# Patient Record
Sex: Female | Born: 1940 | Race: White | Hispanic: No | Marital: Married | Smoking: Never smoker
Health system: Southern US, Community
[De-identification: ages and names within clinical notes are randomized; demographics above are authoritative.]

## PROBLEM LIST (undated history)

## (undated) DIAGNOSIS — E785 Hyperlipidemia, unspecified: Secondary | ICD-10-CM

## (undated) DIAGNOSIS — I10 Essential (primary) hypertension: Secondary | ICD-10-CM

## (undated) HISTORY — PX: GALLBLADDER SURGERY: SHX652

## (undated) HISTORY — DX: Hyperlipidemia, unspecified: E78.5

## (undated) HISTORY — PX: ABDOMINAL HYSTERECTOMY: SHX81

## (undated) HISTORY — DX: Essential (primary) hypertension: I10

---

## 2012-01-30 ENCOUNTER — Other Ambulatory Visit: Payer: Self-pay | Admitting: Cardiology

## 2012-01-30 DIAGNOSIS — N183 Chronic kidney disease, stage 3 unspecified: Secondary | ICD-10-CM

## 2012-02-02 ENCOUNTER — Encounter (INDEPENDENT_AMBULATORY_CARE_PROVIDER_SITE_OTHER): Payer: Medicare Other

## 2012-02-02 DIAGNOSIS — I701 Atherosclerosis of renal artery: Secondary | ICD-10-CM

## 2012-02-02 DIAGNOSIS — N183 Chronic kidney disease, stage 3 unspecified: Secondary | ICD-10-CM

## 2012-02-02 DIAGNOSIS — I700 Atherosclerosis of aorta: Secondary | ICD-10-CM

## 2012-12-29 ENCOUNTER — Other Ambulatory Visit (INDEPENDENT_AMBULATORY_CARE_PROVIDER_SITE_OTHER): Payer: Medicare Other | Admitting: *Deleted

## 2012-12-29 DIAGNOSIS — I701 Atherosclerosis of renal artery: Secondary | ICD-10-CM

## 2015-05-29 DIAGNOSIS — D510 Vitamin B12 deficiency anemia due to intrinsic factor deficiency: Secondary | ICD-10-CM | POA: Diagnosis not present

## 2015-05-29 DIAGNOSIS — D649 Anemia, unspecified: Secondary | ICD-10-CM | POA: Diagnosis not present

## 2015-05-29 DIAGNOSIS — H9319 Tinnitus, unspecified ear: Secondary | ICD-10-CM | POA: Diagnosis not present

## 2015-05-29 DIAGNOSIS — M949 Disorder of cartilage, unspecified: Secondary | ICD-10-CM | POA: Diagnosis not present

## 2015-05-29 DIAGNOSIS — M8580 Other specified disorders of bone density and structure, unspecified site: Secondary | ICD-10-CM | POA: Diagnosis not present

## 2015-05-29 DIAGNOSIS — R413 Other amnesia: Secondary | ICD-10-CM | POA: Diagnosis not present

## 2015-05-29 DIAGNOSIS — N189 Chronic kidney disease, unspecified: Secondary | ICD-10-CM | POA: Diagnosis not present

## 2015-06-12 DIAGNOSIS — D510 Vitamin B12 deficiency anemia due to intrinsic factor deficiency: Secondary | ICD-10-CM | POA: Diagnosis not present

## 2015-07-11 DIAGNOSIS — E538 Deficiency of other specified B group vitamins: Secondary | ICD-10-CM | POA: Diagnosis not present

## 2015-07-11 DIAGNOSIS — R600 Localized edema: Secondary | ICD-10-CM | POA: Diagnosis not present

## 2015-07-11 DIAGNOSIS — I150 Renovascular hypertension: Secondary | ICD-10-CM | POA: Diagnosis not present

## 2015-07-11 DIAGNOSIS — E785 Hyperlipidemia, unspecified: Secondary | ICD-10-CM | POA: Diagnosis not present

## 2015-07-11 DIAGNOSIS — I701 Atherosclerosis of renal artery: Secondary | ICD-10-CM | POA: Diagnosis not present

## 2015-07-11 DIAGNOSIS — N183 Chronic kidney disease, stage 3 (moderate): Secondary | ICD-10-CM | POA: Diagnosis not present

## 2015-07-11 DIAGNOSIS — R809 Proteinuria, unspecified: Secondary | ICD-10-CM | POA: Diagnosis not present

## 2015-07-16 DIAGNOSIS — D510 Vitamin B12 deficiency anemia due to intrinsic factor deficiency: Secondary | ICD-10-CM | POA: Diagnosis not present

## 2015-07-19 DIAGNOSIS — E559 Vitamin D deficiency, unspecified: Secondary | ICD-10-CM | POA: Diagnosis not present

## 2015-07-19 DIAGNOSIS — D649 Anemia, unspecified: Secondary | ICD-10-CM | POA: Diagnosis not present

## 2015-07-19 DIAGNOSIS — Z79899 Other long term (current) drug therapy: Secondary | ICD-10-CM | POA: Diagnosis not present

## 2015-07-19 DIAGNOSIS — E785 Hyperlipidemia, unspecified: Secondary | ICD-10-CM | POA: Diagnosis not present

## 2015-07-19 DIAGNOSIS — Z01419 Encounter for gynecological examination (general) (routine) without abnormal findings: Secondary | ICD-10-CM | POA: Diagnosis not present

## 2015-07-19 DIAGNOSIS — D509 Iron deficiency anemia, unspecified: Secondary | ICD-10-CM | POA: Diagnosis not present

## 2015-08-08 DIAGNOSIS — H6983 Other specified disorders of Eustachian tube, bilateral: Secondary | ICD-10-CM | POA: Diagnosis not present

## 2015-09-03 DIAGNOSIS — R412 Retrograde amnesia: Secondary | ICD-10-CM | POA: Diagnosis not present

## 2015-09-13 DIAGNOSIS — Z1231 Encounter for screening mammogram for malignant neoplasm of breast: Secondary | ICD-10-CM | POA: Diagnosis not present

## 2015-09-25 ENCOUNTER — Ambulatory Visit (INDEPENDENT_AMBULATORY_CARE_PROVIDER_SITE_OTHER): Payer: Medicare HMO | Admitting: Neurology

## 2015-09-25 ENCOUNTER — Encounter: Payer: Self-pay | Admitting: Neurology

## 2015-09-25 ENCOUNTER — Other Ambulatory Visit (INDEPENDENT_AMBULATORY_CARE_PROVIDER_SITE_OTHER): Payer: Medicare HMO

## 2015-09-25 VITALS — BP 132/70 | HR 64 | Resp 16 | Wt 123.0 lb

## 2015-09-25 DIAGNOSIS — F0390 Unspecified dementia without behavioral disturbance: Secondary | ICD-10-CM

## 2015-09-25 DIAGNOSIS — F03A0 Unspecified dementia, mild, without behavioral disturbance, psychotic disturbance, mood disturbance, and anxiety: Secondary | ICD-10-CM

## 2015-09-25 DIAGNOSIS — E785 Hyperlipidemia, unspecified: Secondary | ICD-10-CM

## 2015-09-25 DIAGNOSIS — I10 Essential (primary) hypertension: Secondary | ICD-10-CM | POA: Diagnosis not present

## 2015-09-25 LAB — VITAMIN B12: Vitamin B-12: 474 pg/mL (ref 211–911)

## 2015-09-25 LAB — TSH: TSH: 1.06 u[IU]/mL (ref 0.35–4.50)

## 2015-09-25 MED ORDER — DONEPEZIL HCL 10 MG PO TABS: ORAL_TABLET | ORAL | Status: DC

## 2015-09-25 NOTE — Progress Notes (Signed)
NEUROLOGY CONSULTATION NOTE  Misty Meadows MRN: [id number] DOB: [birth year]  Referring provider: Dr. Lovette Cliche Primary care provider: Dr. Lovette Cliche  Reason for consult:  Memory loss  Dear Dr Lin Landsman:  Thank you for your kind referral of Misty Meadows for consultation of the above symptoms. Although her history is well known to you, please allow me to reiterate it for the purpose of our medical record. The patient was accompanied to the clinic by her husband and son who also provide collateral information. Her other son was in the waiting room and came in towards the end of the visit. Records and images were personally reviewed where available.  HISTORY OF PRESENT ILLNESS: This is a 75 year old right-handed woman with a history of hypertension, hyperlipidemia, presenting for evaluation of worsening memory. She reports that her memory is "not good," she forgets names, sometimes of her own family members, "sometimes my mind just shuts down." She feels memory started changing in the past year, her husband started noticing some differences around 4 years ago, but her son noticed changes 6-8 years ago. 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 She was in charge of bill payments until last year when she asked her husband to do them because she could not do it anymore. He has been making sure she takes her medications over the past year. Family has convinced her not to drive, because as a passenger she would think they need to go the other way. She frequently misplaces things and is always looking for her pocketbook. She has left the stove on. Her family is also concerned about personality changes, one son thinks she is paranoid that people are whispering about her and conspiring against her. Family reports that in her mind there is "another Edd Arbour" (her husband), they  celebrated their 56th anniversary recently and she did not recognize him, or would say someone is driving his truck or says somebody is writing her checks (her granddaughter has been helping with this).   She denies any headaches, dizziness, diplopia, dysarthria, dysphagia, neck/back pain, focal numbness/tingling/weakness, bowel/bladder dysfunction, anosmia, or tremors. She has nightmares and would sometimes scream out at night. She gets scared and anxious easily when she has bad days with her memory. Her mother had Alzheimer's disease. She denies any history of head injuries or alcohol intake.   PAST MEDICAL HISTORY: Past Medical History  Diagnosis Date  . Hypertension   . Hyperlipemia     PAST SURGICAL HISTORY: Past Surgical History  Procedure Laterality Date  . Abdominal hysterectomy    . Gallbladder surgery      MEDICATIONS: Norvasc Aspirin Fenofibrate Lopressor EHT age defying supplement Zocor   No current facility-administered medications on file prior to visit.    ALLERGIES: No Known Allergies  FAMILY HISTORY: Family History  Problem Relation Age of Onset  . Heart disease Father   . Heart disease Mother   . Stroke Sister     SOCIAL HISTORY: Social History   Social History  . Marital Status: Married    Spouse Name: N/A  . Number of Children: 4  . Years of Education: N/A   Occupational History  . Not on file.   Social History Main Topics  . Smoking status: Never Smoker   . Smokeless tobacco: Never Used  . Alcohol Use: No  . Drug Use: No  . Sexual  Activity: Not on file   Other Topics Concern  . Not on file   Social History Narrative    REVIEW OF SYSTEMS: Constitutional: No fevers, chills, or sweats, no generalized fatigue, change in appetite Eyes: No visual changes, double vision, eye pain Ear, nose and throat: No hearing loss, ear pain, nasal congestion, sore throat Cardiovascular: No chest pain, palpitations Respiratory:  No shortness of  breath at rest or with exertion, wheezes GastrointestinaI: No nausea, vomiting, diarrhea, abdominal pain, fecal incontinence Genitourinary:  No dysuria, urinary retention or frequency Musculoskeletal:  No neck pain, back pain Integumentary: No rash, pruritus, skin lesions Neurological: as above Psychiatric: No depression, insomnia, anxiety Endocrine: No palpitations, fatigue, diaphoresis, mood swings, change in appetite, change in weight, increased thirst Hematologic/Lymphatic:  No anemia, purpura, petechiae. Allergic/Immunologic: no itchy/runny eyes, nasal congestion, recent allergic reactions, rashes  PHYSICAL EXAM: Filed Vitals:   09/25/15 1243  BP: 132/70  Pulse: 64  Resp: 16   General: No acute distress Head:  Normocephalic/atraumatic Eyes: Fundoscopic exam shows bilateral sharp discs, no vessel changes, exudates, or hemorrhages Neck: supple, no paraspinal tenderness, full range of motion Back: No paraspinal tenderness Heart: regular rate and rhythm Lungs: Clear to auscultation bilaterally. Vascular: No carotid bruits. Skin/Extremities: No rash, no edema Neurological Exam: Mental status: alert and oriented to person, place, and time, no dysarthria or aphasia, Fund of knowledge is appropriate.  Remote memory intact.  Attention and concentration are normal.    Able to name objects and repeat phrases. CDT 76 MMSE - Mini Mental State Exam 09/25/2015  Orientation to time 5  Orientation to Place 5  Registration 3  Attention/ Calculation 5  Recall 0  Language- name 2 objects 2  Language- repeat 1  Language- follow 3 step command 3  Language- read & follow direction 1  Write a sentence 1  Copy design 0  Total score 26   Cranial nerves: CN I: not tested CN II: pupils equal, round and reactive to light, visual fields intact, fundi unremarkable. CN III, IV, VI:  full range of motion, no nystagmus, no ptosis CN V: facial sensation intact CN VII: upper and lower face  symmetric CN VIII: hearing intact to finger rub CN IX, X: gag intact, uvula midline CN XI: sternocleidomastoid and trapezius muscles intact CN XII: tongue midline Bulk & Tone: normal, no fasciculations. Motor: 5/5 throughout with no pronator drift. Sensation: decreased vibration to ankles bilaterally, otherwise intact to light touch, cold, pin, and joint position sense.  No extinction to double simultaneous stimulation.  Romberg test negative Deep Tendon Reflexes: +2 throughout, no ankle clonus Plantar responses: downgoing bilaterally Cerebellar: no incoordination on finger to nose, heel to shin. No dysdiadochokinesia Gait: narrow-based and steady, able to tandem walk adequately. Tremor: none  IMPRESSION: This is a 75 year old right-handed woman with a history of hypertension, hyperlipidemia, presenting for evaluation of worsening memory. MMSE today is 26/30 indicating mild cognitive impairment, however history is suggestive of mild dementia. We discussed different causes of memory loss. Check TSH and B12. MRI brain without contrast will be ordered to assess for underlying structural abnormality and assess vascular load. We discussed that she may benefit from starting cholinesterase inhibitors such as Aricept, side effects and expectations from the medication were discussed. She was given a prescription for Aricept 5mg  daily for 1 month, then increase to 10mg  daily. Family asked about a supplement they found online to help with memory, they have not noticed much change, we discussed at this time  there are no evidence-based studies that this is helpful, would stop medication. We discussed psychological and behavioral changes that can also be seen with dementia, continue to monitor. We discussed the importance of control of vascular risk factors, physical exercise, and brain stimulation exercises for brain health. She will follow-up in 7-8 months and knows to call for any changes.   Thank you for  allowing me to participate in the care of this patient. Please do not hesitate to call for any questions or concerns.   Ellouise Newer, M.D.  CC: Dr. Lin Landsman

## 2015-09-25 NOTE — Patient Instructions (Addendum)
1. Bloodwork for TSH, B12 2. Schedule MRI brain without contrast 3. Start Aricept 10mg : take 1/2 tablet daily for 1 month, then increase to 1 tablet daily  4. Control of blood pressure, cholesterol, as well as physical exercise and brain stimulation exercises are important for brain health 5. Look into the [employer] of [place] for support groups, more informational resources  [facility] [address], [id number] [place], [place] [address] P: [phone] 6. Follow-up in 7-8 months

## 2015-09-27 ENCOUNTER — Telehealth: Payer: Self-pay | Admitting: Family Medicine

## 2015-09-27 NOTE — Telephone Encounter (Signed)
Patient's husband was notified of results.

## 2015-09-27 NOTE — Telephone Encounter (Signed)
-----   Message from Cameron Sprang, MD sent at 09/26/2015 10:03 AM EST ----- Pls let her/family know thyroid and B12 levels are normal. Thanks

## 2015-09-28 DIAGNOSIS — F0390 Unspecified dementia without behavioral disturbance: Secondary | ICD-10-CM | POA: Insufficient documentation

## 2015-09-28 DIAGNOSIS — F03A0 Unspecified dementia, mild, without behavioral disturbance, psychotic disturbance, mood disturbance, and anxiety: Secondary | ICD-10-CM | POA: Insufficient documentation

## 2015-09-28 DIAGNOSIS — E785 Hyperlipidemia, unspecified: Secondary | ICD-10-CM | POA: Insufficient documentation

## 2015-09-28 DIAGNOSIS — I10 Essential (primary) hypertension: Secondary | ICD-10-CM | POA: Insufficient documentation

## 2015-10-01 ENCOUNTER — Ambulatory Visit: Payer: Self-pay | Admitting: Neurology

## 2015-10-02 ENCOUNTER — Telehealth: Payer: Self-pay | Admitting: Neurology

## 2015-10-02 NOTE — Telephone Encounter (Signed)
Pt husband needs to talk to someone about medication donepezil she is on a half of a pill and is very tried is this normal please call [phone]

## 2015-10-02 NOTE — Telephone Encounter (Signed)
I spoke with patient's husband after talking to Dr. Delice Lesch about patients current symptom. Per Dr. Delice Lesch for now she wants patient to continue with taking the 1/2 tablet a day since she just started on it last week. Want to see if this subsides. The patients husband also mentioned to me that patient was just started on a sleeping as well by her pcp and when he spoke with them he states they told him that the sleeping pill could be causing this. So they rec that patient not take the sleeping pill and to try at first to go to sleep without it and if they felt like she needed it try giving her half a pill. Patient is going to continue with the Aricept for now and will call if there are anymore issues or questions.

## 2015-10-02 NOTE — Telephone Encounter (Signed)
Tried returning call twice, line is busy. Will call again.

## 2015-10-07 DIAGNOSIS — G4762 Sleep related leg cramps: Secondary | ICD-10-CM | POA: Diagnosis not present

## 2015-10-07 DIAGNOSIS — R252 Cramp and spasm: Secondary | ICD-10-CM | POA: Diagnosis not present

## 2015-10-10 ENCOUNTER — Ambulatory Visit (HOSPITAL_COMMUNITY): Admission: RE | Admit: 2015-10-10 | Payer: Medicare HMO | Source: Ambulatory Visit

## 2015-10-12 ENCOUNTER — Ambulatory Visit (HOSPITAL_COMMUNITY)
Admission: RE | Admit: 2015-10-12 | Discharge: 2015-10-12 | Disposition: A | Discharge status: Home / Self Care | Payer: Medicare HMO | Source: Ambulatory Visit | Attending: Neurology | Admitting: Neurology

## 2015-10-12 ENCOUNTER — Telehealth: Payer: Self-pay | Admitting: Neurology

## 2015-10-12 DIAGNOSIS — I998 Other disorder of circulatory system: Secondary | ICD-10-CM | POA: Insufficient documentation

## 2015-10-12 DIAGNOSIS — F0390 Unspecified dementia without behavioral disturbance: Secondary | ICD-10-CM | POA: Insufficient documentation

## 2015-10-12 NOTE — Telephone Encounter (Signed)
-----   Message from Cameron Sprang, MD sent at 10/12/2015 12:12 PM EST ----- Pls let patient know I reviewed MRI brain, it is unremarkable, no evidence of tumor, stroke, or bleed. It shows age-related changes. Thanks

## 2015-10-12 NOTE — Telephone Encounter (Signed)
Patient made aware.

## 2015-10-16 DIAGNOSIS — K921 Melena: Secondary | ICD-10-CM | POA: Diagnosis not present

## 2015-10-16 DIAGNOSIS — K648 Other hemorrhoids: Secondary | ICD-10-CM | POA: Diagnosis not present

## 2015-10-16 DIAGNOSIS — N189 Chronic kidney disease, unspecified: Secondary | ICD-10-CM | POA: Diagnosis not present

## 2015-10-16 DIAGNOSIS — R350 Frequency of micturition: Secondary | ICD-10-CM | POA: Diagnosis not present

## 2015-11-15 ENCOUNTER — Telehealth: Payer: Self-pay | Admitting: Family Medicine

## 2015-11-15 DIAGNOSIS — H353131 Nonexudative age-related macular degeneration, bilateral, early dry stage: Secondary | ICD-10-CM | POA: Diagnosis not present

## 2015-11-15 DIAGNOSIS — H26491 Other secondary cataract, right eye: Secondary | ICD-10-CM | POA: Diagnosis not present

## 2015-11-15 DIAGNOSIS — H25812 Combined forms of age-related cataract, left eye: Secondary | ICD-10-CM | POA: Diagnosis not present

## 2015-11-15 NOTE — Telephone Encounter (Signed)
Go back to 1/2 tab daily for another 2 weeks and see if bowel symptoms improve. If they do, increase again to 1 tablet. Thanks

## 2015-11-15 NOTE — Telephone Encounter (Signed)
Patients husband called after hours on call center yesterday @ 5:39 pm. He stated that since patient started donepezil she has had increased bowel movements. She has been having minor rectal bleeding with these frequent bowel movements.   I spoke with patient & her husband this morning. Bowel movements are not diarrhea or loose, but frequent. He states that when patient started on 1/2 pill bowel movements increased some. When patient went to a whole pill patient has bowel movements at least 3 times a day, some days more than 3. Patient states that she has no stomach pain or cramping. She does feel rectal pressure majority of the time, which is usually relieved some after a BM. Patient states she does have hemorrhoids as well. Patients husband does state that while patient was on 1/2 pill they didn't notice any difference with her memory, but when she increased to the whole pill he did notice improvement. Please advise.

## 2015-11-15 NOTE — Telephone Encounter (Signed)
Spoke with Mr. Door again and notified him of advisement. Advised him to call us back after 2 weeks if patients sxs still haven't improved.

## 2015-11-19 DIAGNOSIS — J209 Acute bronchitis, unspecified: Secondary | ICD-10-CM | POA: Diagnosis not present

## 2015-11-21 DIAGNOSIS — I10 Essential (primary) hypertension: Secondary | ICD-10-CM | POA: Diagnosis not present

## 2015-11-21 DIAGNOSIS — R42 Dizziness and giddiness: Secondary | ICD-10-CM | POA: Diagnosis not present

## 2015-11-23 DIAGNOSIS — Z0000 Encounter for general adult medical examination without abnormal findings: Secondary | ICD-10-CM | POA: Diagnosis not present

## 2015-11-23 DIAGNOSIS — J329 Chronic sinusitis, unspecified: Secondary | ICD-10-CM | POA: Diagnosis not present

## 2015-11-23 DIAGNOSIS — I10 Essential (primary) hypertension: Secondary | ICD-10-CM | POA: Diagnosis not present

## 2015-12-03 DIAGNOSIS — R5383 Other fatigue: Secondary | ICD-10-CM | POA: Diagnosis not present

## 2015-12-03 DIAGNOSIS — I951 Orthostatic hypotension: Secondary | ICD-10-CM | POA: Diagnosis not present

## 2015-12-31 DIAGNOSIS — N183 Chronic kidney disease, stage 3 (moderate): Secondary | ICD-10-CM | POA: Diagnosis not present

## 2015-12-31 DIAGNOSIS — R809 Proteinuria, unspecified: Secondary | ICD-10-CM | POA: Diagnosis not present

## 2016-01-04 DIAGNOSIS — R030 Elevated blood-pressure reading, without diagnosis of hypertension: Secondary | ICD-10-CM | POA: Diagnosis not present

## 2016-01-04 DIAGNOSIS — R69 Illness, unspecified: Secondary | ICD-10-CM | POA: Diagnosis not present

## 2016-01-10 DIAGNOSIS — N183 Chronic kidney disease, stage 3 (moderate): Secondary | ICD-10-CM | POA: Diagnosis not present

## 2016-01-10 DIAGNOSIS — R809 Proteinuria, unspecified: Secondary | ICD-10-CM | POA: Diagnosis not present

## 2016-01-10 DIAGNOSIS — E785 Hyperlipidemia, unspecified: Secondary | ICD-10-CM | POA: Diagnosis not present

## 2016-01-10 DIAGNOSIS — I701 Atherosclerosis of renal artery: Secondary | ICD-10-CM | POA: Diagnosis not present

## 2016-01-17 ENCOUNTER — Encounter: Payer: Self-pay | Admitting: Family Medicine

## 2016-03-10 DIAGNOSIS — B0229 Other postherpetic nervous system involvement: Secondary | ICD-10-CM | POA: Diagnosis not present

## 2016-05-15 DIAGNOSIS — H353131 Nonexudative age-related macular degeneration, bilateral, early dry stage: Secondary | ICD-10-CM | POA: Diagnosis not present

## 2016-05-15 DIAGNOSIS — H26491 Other secondary cataract, right eye: Secondary | ICD-10-CM | POA: Diagnosis not present

## 2016-05-15 DIAGNOSIS — H25812 Combined forms of age-related cataract, left eye: Secondary | ICD-10-CM | POA: Diagnosis not present

## 2016-05-16 ENCOUNTER — Ambulatory Visit (INDEPENDENT_AMBULATORY_CARE_PROVIDER_SITE_OTHER): Payer: Medicare HMO | Admitting: Neurology

## 2016-05-16 ENCOUNTER — Encounter: Payer: Self-pay | Admitting: Neurology

## 2016-05-16 ENCOUNTER — Ambulatory Visit: Payer: Self-pay | Admitting: Neurology

## 2016-05-16 VITALS — BP 130/72 | HR 69 | Temp 97.5°F | Wt 132.1 lb

## 2016-05-16 DIAGNOSIS — I10 Essential (primary) hypertension: Secondary | ICD-10-CM | POA: Diagnosis not present

## 2016-05-16 DIAGNOSIS — F03A0 Unspecified dementia, mild, without behavioral disturbance, psychotic disturbance, mood disturbance, and anxiety: Secondary | ICD-10-CM

## 2016-05-16 DIAGNOSIS — F0390 Unspecified dementia without behavioral disturbance: Secondary | ICD-10-CM

## 2016-05-16 DIAGNOSIS — E785 Hyperlipidemia, unspecified: Secondary | ICD-10-CM | POA: Diagnosis not present

## 2016-05-16 MED ORDER — RIVASTIGMINE TARTRATE 1.5 MG PO CAPS: 1.5000 mg | ORAL_CAPSULE | Freq: Two times a day (BID) | ORAL | 3 refills | Status: DC

## 2016-05-16 NOTE — Progress Notes (Signed)
NEUROLOGY FOLLOW UP OFFICE NOTE  Misty Meadows [id number]  HISTORY OF PRESENT ILLNESS: I had the pleasure of seeing Misty Meadows in follow-up in the neurology clinic on 05/16/2016.  The patient was last seen 7 months ago for worsening memory. She is again accompanied by her husband and son who help supplement the history today.  Records and images were personally reviewed where available.  I personally reviewed MRI brain without contrast which did not show any acute changes, there was mild diffuse atrophy and mild chronic microvascular disease. TSH and B12 normal. Her MMSE in February 2017 was 26/30. She was started on Aricept 10mg  daily. Family report that this made a "100% change" in her, she could remember better and was sharper. Her son also reported that she was better overall since her last visit, but still has memory gaps. Her husband reports she stacks dishes a different way now. She recalls names better now. Her husband feels she was resting better, but now is not resting so well again. She does not drive and states her family will not let her. She feels her memory is "not that great." She had a "depression feeling" this week. Her husband reports that if he corrects her, she thinks he is yelling at her, then 5 minutes later she is okay. She reports there are days that she gets very sensitive. They deny any paranoia. She is overall tolerating the Aricept except for nightly bad dreams, sometimes she wakes up screaming. She denies any headaches but has a vibrating sensation in the back of her head, she could her popping and cracking, sometimes family sitting beside her could audibly hear it as well. She has neck pain. No focal numbness/tingling/weakness. No falls.   HPI 09/25/2015: This is a 75 yo RH woman with a history of hypertension, hyperlipidemia, with worsening memory. She reports that her memory is "not good," she forgets names, sometimes of her own family members, "sometimes my mind just  shuts down." She feels memory started changing in the past year, her husband started noticing some differences around 4 years ago, but her son noticed changes 6-8 years ago. 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 She was in charge of bill payments until last year when she asked her husband to do them because she could not do it anymore. He has been making sure she takes her medications over the past year. Family has convinced her not to drive, because as a passenger she would think they need to go the other way. She frequently misplaces things and is always looking for her pocketbook. She has left the stove on. Her family is also concerned about personality changes, one son thinks she is paranoid that people are whispering about her and conspiring against her. Family reports that in her mind there is "another Edd Arbour" (her husband), they celebrated their 56th anniversary recently and she did not recognize him, or would say someone is driving his truck or says somebody is writing her checks (her granddaughter has been helping with this). Her mother had Alzheimer's disease. She denies any history of head injuries or alcohol intake.   PAST MEDICAL HISTORY: Past Medical History:  Diagnosis Date  . Hyperlipemia   . Hypertension     MEDICATIONS: Current Outpatient Prescriptions on File Prior to Visit  Medication Sig Dispense Refill  . amLODipine (NORVASC) 10 MG tablet Take  10 mg by mouth daily.    Marland Kitchen aspirin 81 MG tablet Take 81 mg by mouth daily.    Marland Kitchen donepezil (ARICEPT) 10 MG tablet Take 1/2 tablet daily for 1 month, then increase to 1 tablet daily 30 tablet 11  . fenofibrate 160 MG tablet Take 160 mg by mouth daily.    . metoprolol tartrate (LOPRESSOR) 25 MG tablet Take 25 mg by mouth 2 (two) times daily.    . Multiple Vitamins-Minerals (ICAPS MV PO) Take by mouth daily.      . NON FORMULARY EHT age defying supplement; Take 2 tablets daily    . simvastatin (ZOCOR) 20 MG tablet Take 20 mg by mouth daily.     No current facility-administered medications on file prior to visit.     ALLERGIES: No Known Allergies  FAMILY HISTORY: Family History  Problem Relation Age of Onset  . Heart disease Father   . Heart disease Mother   . Stroke Sister     SOCIAL HISTORY: Social History   Social History  . Marital status: Married    Spouse name: N/A  . Number of children: 4  . Years of education: N/A   Occupational History  . Not on file.   Social History Main Topics  . Smoking status: Never Smoker  . Smokeless tobacco: Never Used  . Alcohol use No  . Drug use: No  . Sexual activity: Not on file   Other Topics Concern  . Not on file   Social History Narrative  . No narrative on file    REVIEW OF SYSTEMS: Constitutional: No fevers, chills, or sweats, no generalized fatigue, change in appetite Eyes: No visual changes, double vision, eye pain Ear, nose and throat: No hearing loss, ear pain, nasal congestion, sore throat Cardiovascular: No chest pain, palpitations Respiratory:  No shortness of breath at rest or with exertion, wheezes GastrointestinaI: No nausea, vomiting, diarrhea, abdominal pain, fecal incontinence Genitourinary:  No dysuria, urinary retention or frequency Musculoskeletal:  No neck pain, back pain Integumentary: No rash, pruritus, skin lesions Neurological: as above Psychiatric: No depression, insomnia, anxiety Endocrine: No palpitations, fatigue, diaphoresis, mood swings, change in appetite, change in weight, increased thirst Hematologic/Lymphatic:  No anemia, purpura, petechiae. Allergic/Immunologic: no itchy/runny eyes, nasal congestion, recent allergic reactions, rashes  PHYSICAL EXAM: Vitals:   05/16/16 1025  BP: 130/72  Pulse: 69  Temp: 97.5 F (36.4 C)   General: No acute distress Head:   Normocephalic/atraumatic Neck: supple, no paraspinal tenderness, full range of motion with occasional crepitus Heart:  Regular rate and rhythm Lungs:  Clear to auscultation bilaterally Back: No paraspinal tenderness Skin/Extremities: No rash, no edema Neurological Exam: alert and oriented to person, place, and time. No aphasia or dysarthria. Fund of knowledge is appropriate.  Recent and remote memory are intact.  Attention and concentration are normal.    Able to name objects and repeat phrases. CDT 4/5 MMSE - Mini Mental State Exam 05/16/2016 09/25/2015  Orientation to time 5 5  Orientation to Place 5 5  Registration 3 3  Attention/ Calculation 5 5  Recall 3 0  Language- name 2 objects 2 2  Language- repeat 1 1  Language- follow 3 step command 2 3  Language- read & follow direction 1 1  Write a sentence 1 1  Copy design 0 0  Total score 28 26   Cranial nerves: Pupils equal, round, reactive to light.  Extraocular movements intact with no nystagmus. Visual fields full. Facial sensation intact. No  facial asymmetry. Tongue, uvula, palate midline.  Motor: Bulk and tone normal, muscle strength 5/5 throughout with no pronator drift.  Sensation to light touch intact.  No extinction to double simultaneous stimulation.  Deep tendon reflexes 2+ throughout, toes downgoing.  Finger to nose testing intact.  Gait narrow-based and steady, able to tandem walk adequately.  Romberg negative.  IMPRESSION: This is a 75 yo RH woman with a history of hypertension, hyperlipidemia, with worsening memory. MMSE in February 2017 was 26/30, indicating mild cognitive impairment, however history was suggestive of mild dementia. MMSE today better 28/30, family has reported an improvement as well with Aricept, however she does not like the bad dreams and would like to switch medications. She will start Exelon 1.5mg  BID, we will uptitrate as tolerated, side effects were discussed.  We again discussed the importance of control  of vascular risk factors, physical exercise, and brain stimulation exercises for brain health. She will follow-up in 6 months and knows to call for any changes  Thank you for allowing me to participate in her care.  Please do not hesitate to call for any questions or concerns.  The duration of this appointment visit was 25 minutes of face-to-face time with the patient.  Greater than 50% of this time was spent in counseling, explanation of diagnosis, planning of further management, and coordination of care.   Ellouise Newer, M.D.   CC: Dr. Lin Landsman

## 2016-05-16 NOTE — Patient Instructions (Signed)
1. Stop the donepezil. 2. Start Rivastigmine 1.5mg : Take 1 capsule twice a day 3. Control of blood pressure, cholesterol, as well as physical exercise and brain stimulation exercises are important for brain health. Look into the [place] diet  4. Follow-up in 6 months, call for any changes

## 2016-05-19 DIAGNOSIS — Z23 Encounter for immunization: Secondary | ICD-10-CM | POA: Diagnosis not present

## 2016-05-22 ENCOUNTER — Telehealth: Payer: Self-pay

## 2016-05-22 DIAGNOSIS — N390 Urinary tract infection, site not specified: Secondary | ICD-10-CM | POA: Diagnosis not present

## 2016-05-22 DIAGNOSIS — N3000 Acute cystitis without hematuria: Secondary | ICD-10-CM | POA: Diagnosis not present

## 2016-05-22 DIAGNOSIS — R51 Headache: Secondary | ICD-10-CM | POA: Diagnosis not present

## 2016-05-22 DIAGNOSIS — R112 Nausea with vomiting, unspecified: Secondary | ICD-10-CM | POA: Diagnosis not present

## 2016-05-22 DIAGNOSIS — R030 Elevated blood-pressure reading, without diagnosis of hypertension: Secondary | ICD-10-CM | POA: Diagnosis not present

## 2016-05-22 DIAGNOSIS — K5730 Diverticulosis of large intestine without perforation or abscess without bleeding: Secondary | ICD-10-CM | POA: Diagnosis not present

## 2016-05-22 DIAGNOSIS — I10 Essential (primary) hypertension: Secondary | ICD-10-CM | POA: Diagnosis not present

## 2016-05-22 DIAGNOSIS — R0602 Shortness of breath: Secondary | ICD-10-CM | POA: Diagnosis not present

## 2016-05-22 DIAGNOSIS — R109 Unspecified abdominal pain: Secondary | ICD-10-CM | POA: Diagnosis not present

## 2016-05-22 NOTE — Telephone Encounter (Signed)
Notified patient's husbands of the options. They want to try increasing Rivastigmine to two tablets twice a day. Advised husband to notify office with any changes or not seeing a difference with the increased dosage.

## 2016-05-22 NOTE — Telephone Encounter (Signed)
Options are increasing the medication to 2 tablets twice a day, or going back to the Aricept and keeping an eye on the bad dreams. Thanks

## 2016-05-22 NOTE — Telephone Encounter (Signed)
Pt's husband called and states since she stopped the Donepezil and started Rivastigmine she is declining. Pt is now asking questions such as where the bathroom is and how long she has been cooking in their kitchen. Please advise.

## 2016-05-23 DIAGNOSIS — K5730 Diverticulosis of large intestine without perforation or abscess without bleeding: Secondary | ICD-10-CM | POA: Diagnosis not present

## 2016-05-26 ENCOUNTER — Telehealth: Payer: Self-pay | Admitting: Neurology

## 2016-05-26 ENCOUNTER — Other Ambulatory Visit: Payer: Self-pay

## 2016-05-26 MED ORDER — DONEPEZIL HCL 10 MG PO TABS: 10.0000 mg | ORAL_TABLET | Freq: Every day | ORAL | 11 refills | Status: DC

## 2016-05-26 NOTE — Telephone Encounter (Signed)
Advised patients husband. They agree for patient to go back on Aricept. Rx sent to pharmacy.

## 2016-05-26 NOTE — Telephone Encounter (Signed)
Spoke with patients husband and he states since her increase of Rivastigmine on 10/5 she has been nauseous and throwing up every evening. He states he also took her to ED on Thursday night because her BP went up. They are wanting to know if these are normal side effects of this medication. If so they want to know if you think patient should just go back to taking Aricept. Please advise.

## 2016-05-26 NOTE — Telephone Encounter (Signed)
PT called and would like a call back/Dawn CB# [phone]

## 2016-05-26 NOTE — Telephone Encounter (Signed)
It seems she is having too much trouble with this medication. If they are okay with going back to Aricept, would stop Rivastigmine for a week until she feels better, then restart the Aricept once she is feeling well. Thanks

## 2016-05-27 NOTE — Telephone Encounter (Signed)
Her husband called back this morning complaining of the new medication his wife Misty Meadows was put on. She has stopped the medicine. He said she was really "out of it" last night. He would like you to please call him at [phone]. Thank you

## 2016-05-27 NOTE — Telephone Encounter (Signed)
Ok to start the Aricept, although I'm not quite sure all of this is due to medication. If she is still having these symptoms despite starting Aricept, they should call her PCP to have her evaluated. Thanks.

## 2016-05-27 NOTE — Telephone Encounter (Signed)
Notified patient's husband of below. Verbalized understanding.

## 2016-05-27 NOTE — Telephone Encounter (Signed)
Patients husband called and states without any medication his wife was up all night pacing the floors and doing crazy things. She did not have any nausea or throwing up yesterday evening. Husband wants to know if its okay to go ahead and start Aricept? He also asked if there is something he could give her to help her sleep? Please advise.

## 2016-05-28 DIAGNOSIS — N3000 Acute cystitis without hematuria: Secondary | ICD-10-CM | POA: Diagnosis not present

## 2016-05-28 DIAGNOSIS — N3090 Cystitis, unspecified without hematuria: Secondary | ICD-10-CM | POA: Diagnosis not present

## 2016-06-02 ENCOUNTER — Telehealth: Payer: Self-pay | Admitting: Neurology

## 2016-06-02 NOTE — Telephone Encounter (Signed)
Patient husband called and want would like to talk to someone about wife medication please call [phone]  patient husband name is Edd Arbour

## 2016-06-02 NOTE — Telephone Encounter (Signed)
Ok to give in morning, thanks

## 2016-06-02 NOTE — Telephone Encounter (Signed)
Patient's husband wants to know if its okay to give his wife the Aricept in the mornings. When he started her back on the medication the bottle says to give at night but she's having the bad dreams, gets into things, and he feels it starts to wear off during the day. He gave her a dose last night but does not want it to get out of her system so if he can start giving it in the morning would this morning be okay to start? Please advise.

## 2016-06-02 NOTE — Telephone Encounter (Signed)
Patient advised.

## 2016-06-11 ENCOUNTER — Telehealth: Payer: Self-pay | Admitting: Neurology

## 2016-06-11 NOTE — Telephone Encounter (Signed)
Patient husband Edd Arbour called and needs to speak to someone about medication he states it is not working please call patient husband  at [phone]

## 2016-06-13 ENCOUNTER — Other Ambulatory Visit: Payer: Self-pay

## 2016-06-13 MED ORDER — RIVASTIGMINE 4.6 MG/24HR TD PT24: 4.6000 mg | MEDICATED_PATCH | Freq: Every day | TRANSDERMAL | 6 refills | Status: DC

## 2016-06-13 NOTE — Telephone Encounter (Signed)
Contacted patient's husband. He states the Aricept 10mg  is not working for his wife. She is not remembering things like who he is. She also is not resting good. He states the Rivastigmine one tablet did not work for her and when increased to two tablets it made her sick. Please advise.

## 2016-06-13 NOTE — Telephone Encounter (Signed)
Pls let him know that all the medications are to slow down progression of worsening of memory. It's not that she takes it and starts remembering things, the medication does not stop or reverse the condition, it just slows it down. She may be able to tolerate a patch better, he will have to help her with it, to put on then replace every 24 hours. Pls send Rx for Exelon patch 4.6mg /24hr #30 with 6 refills. Thanks

## 2016-06-13 NOTE — Telephone Encounter (Signed)
Patient called to be sure Dr. Delice Lesch recommends the Exelon patch. Patient was concerned about husband calling in to request medication changes. Patient stated she would like to try patch but would call us with any questions or concerns.

## 2016-06-13 NOTE — Telephone Encounter (Signed)
Notified patient's husband Edd Arbour of below. Sent RX to pharmacy.

## 2016-06-14 DIAGNOSIS — R300 Dysuria: Secondary | ICD-10-CM | POA: Diagnosis not present

## 2016-06-14 DIAGNOSIS — G4700 Insomnia, unspecified: Secondary | ICD-10-CM | POA: Diagnosis not present

## 2016-08-05 DIAGNOSIS — E785 Hyperlipidemia, unspecified: Secondary | ICD-10-CM | POA: Diagnosis not present

## 2016-08-05 DIAGNOSIS — R69 Illness, unspecified: Secondary | ICD-10-CM | POA: Diagnosis not present

## 2016-08-05 DIAGNOSIS — Z0000 Encounter for general adult medical examination without abnormal findings: Secondary | ICD-10-CM | POA: Diagnosis not present

## 2016-08-05 DIAGNOSIS — Z6820 Body mass index (BMI) 20.0-20.9, adult: Secondary | ICD-10-CM | POA: Diagnosis not present

## 2016-09-17 DIAGNOSIS — N189 Chronic kidney disease, unspecified: Secondary | ICD-10-CM | POA: Diagnosis not present

## 2016-09-17 DIAGNOSIS — E559 Vitamin D deficiency, unspecified: Secondary | ICD-10-CM | POA: Diagnosis not present

## 2016-09-17 DIAGNOSIS — R5383 Other fatigue: Secondary | ICD-10-CM | POA: Diagnosis not present

## 2016-09-17 DIAGNOSIS — E785 Hyperlipidemia, unspecified: Secondary | ICD-10-CM | POA: Diagnosis not present

## 2016-09-19 DIAGNOSIS — D510 Vitamin B12 deficiency anemia due to intrinsic factor deficiency: Secondary | ICD-10-CM | POA: Diagnosis not present

## 2016-09-19 DIAGNOSIS — Z1231 Encounter for screening mammogram for malignant neoplasm of breast: Secondary | ICD-10-CM | POA: Diagnosis not present

## 2016-09-26 DIAGNOSIS — D510 Vitamin B12 deficiency anemia due to intrinsic factor deficiency: Secondary | ICD-10-CM | POA: Diagnosis not present

## 2016-10-02 DIAGNOSIS — D510 Vitamin B12 deficiency anemia due to intrinsic factor deficiency: Secondary | ICD-10-CM | POA: Diagnosis not present

## 2016-10-09 DIAGNOSIS — J0100 Acute maxillary sinusitis, unspecified: Secondary | ICD-10-CM | POA: Diagnosis not present

## 2016-10-09 DIAGNOSIS — J209 Acute bronchitis, unspecified: Secondary | ICD-10-CM | POA: Diagnosis not present

## 2016-10-13 DIAGNOSIS — D510 Vitamin B12 deficiency anemia due to intrinsic factor deficiency: Secondary | ICD-10-CM | POA: Diagnosis not present

## 2016-10-28 DIAGNOSIS — J069 Acute upper respiratory infection, unspecified: Secondary | ICD-10-CM | POA: Diagnosis not present

## 2016-10-28 DIAGNOSIS — R300 Dysuria: Secondary | ICD-10-CM | POA: Diagnosis not present

## 2016-10-28 DIAGNOSIS — R05 Cough: Secondary | ICD-10-CM | POA: Diagnosis not present

## 2016-10-29 DIAGNOSIS — Z79899 Other long term (current) drug therapy: Secondary | ICD-10-CM | POA: Diagnosis not present

## 2016-10-29 DIAGNOSIS — J1000 Influenza due to other identified influenza virus with unspecified type of pneumonia: Secondary | ICD-10-CM | POA: Diagnosis not present

## 2016-10-29 DIAGNOSIS — E7800 Pure hypercholesterolemia, unspecified: Secondary | ICD-10-CM | POA: Diagnosis not present

## 2016-10-29 DIAGNOSIS — J180 Bronchopneumonia, unspecified organism: Secondary | ICD-10-CM | POA: Diagnosis not present

## 2016-10-29 DIAGNOSIS — I10 Essential (primary) hypertension: Secondary | ICD-10-CM | POA: Diagnosis not present

## 2016-10-29 DIAGNOSIS — R05 Cough: Secondary | ICD-10-CM | POA: Diagnosis not present

## 2016-10-29 DIAGNOSIS — R69 Illness, unspecified: Secondary | ICD-10-CM | POA: Diagnosis not present

## 2016-10-29 DIAGNOSIS — J101 Influenza due to other identified influenza virus with other respiratory manifestations: Secondary | ICD-10-CM | POA: Diagnosis not present

## 2016-10-29 DIAGNOSIS — R531 Weakness: Secondary | ICD-10-CM | POA: Diagnosis not present

## 2016-10-29 DIAGNOSIS — J42 Unspecified chronic bronchitis: Secondary | ICD-10-CM | POA: Diagnosis not present

## 2016-11-11 DIAGNOSIS — J209 Acute bronchitis, unspecified: Secondary | ICD-10-CM | POA: Diagnosis not present

## 2016-11-11 DIAGNOSIS — J0100 Acute maxillary sinusitis, unspecified: Secondary | ICD-10-CM | POA: Diagnosis not present

## 2016-11-19 ENCOUNTER — Encounter: Payer: Self-pay | Admitting: Neurology

## 2016-11-19 ENCOUNTER — Ambulatory Visit (INDEPENDENT_AMBULATORY_CARE_PROVIDER_SITE_OTHER): Payer: Medicare HMO | Admitting: Neurology

## 2016-11-19 VITALS — BP 128/86 | HR 72 | Temp 98.3°F | Resp 16 | Ht 64.0 in | Wt 115.0 lb

## 2016-11-19 DIAGNOSIS — F03A0 Unspecified dementia, mild, without behavioral disturbance, psychotic disturbance, mood disturbance, and anxiety: Secondary | ICD-10-CM

## 2016-11-19 DIAGNOSIS — R69 Illness, unspecified: Secondary | ICD-10-CM | POA: Diagnosis not present

## 2016-11-19 DIAGNOSIS — F0390 Unspecified dementia without behavioral disturbance: Secondary | ICD-10-CM | POA: Diagnosis not present

## 2016-11-19 MED ORDER — DONEPEZIL HCL 10 MG PO TABS: ORAL_TABLET | ORAL | 11 refills | Status: DC

## 2016-11-19 MED ORDER — MEMANTINE HCL 10 MG PO TABS: ORAL_TABLET | ORAL | 11 refills | Status: DC

## 2016-11-19 NOTE — Progress Notes (Signed)
NEUROLOGY FOLLOW UP OFFICE NOTE  Naydene Kamrowski [id number]  HISTORY OF PRESENT ILLNESS: I had the pleasure of seeing Misty Meadows in follow-up in the neurology clinic on 11/19/2016.  The patient was last seen 6 months ago for worsening memory suggestive of mild dementia. She is again accompanied by her husband and son who help supplement the history today.  MMSE in September 2017 was 28/30, 26/30 in February 2017. She was having bad dreams on the Aricept and wanted to switch. She started Exelon but her husband called to report she was declining, asking where the bathroom was and how long she had been cooking in the kitchen. This caused her to become nauseated and vomit every night, with her BP going up. They requested to resume Aricept, then called back when she was off medication to report that she was up all night pacing and doing crazy things. Aricept was restarted and she again started having bad dreams, "getting into things," and husband felt it was "wearing off during the day. He called a week later to report it was not working because she would not recall who he was and she was not resting good. It was explained that this was likely due to underlying dementia rather than the medication, and she was switched to the Exelon patch in October 2017.   They present today stating she refused to try the patch. She has been back to taking the Aricept 10mg  every morning and overall is tolerating the medication. She states her memory is okay, her husband reports worsening. She does not drive. She does not cook anymore. He is in charge of bills and manages her medications. She sometimes needs help putting on her blouse, but for the majority does it herself. She misplaces things frequently, they are always hunting for things, she puts her underwear in a different drawer. They deny any significant personality changes, but sometimes "there are 2 Ronnies" and she does not recognize her husband. Her son notes she  repeats herself after 5-10 minutes. If she had an appointment, she would ask several times a day about it. She wants to go to her appointments early. She denies any headaches, dizziness, vision changes, focal numbness/tingling/weakness, no falls.   HPI 09/25/2015: This is a 76 yo RH woman with a history of hypertension, hyperlipidemia, with worsening memory. She reports that her memory is "not good," she forgets names, sometimes of her own family members, "sometimes my mind just shuts down." She feels memory started changing in the past year, her husband started noticing some differences around 4 years ago, but her son noticed changes 6-8 years ago. 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 She was in charge of bill payments until last year when she asked her husband to do them because she could not do it anymore. He has been making sure she takes her medications over the past year. Family has convinced her not to drive, because as a passenger she would think they need to go the other way. She frequently misplaces things and is always looking for her pocketbook. She has left the stove on. Her family is also concerned about personality changes, one son thinks she is paranoid that people are whispering about her and conspiring against her. Family reports that in her mind there is "another Edd Arbour" (her husband), they celebrated their 56th anniversary recently and she did  not recognize him, or would say someone is driving his truck or says somebody is writing her checks (her granddaughter has been helping with this). Her mother had Alzheimer's disease. She denies any history of head injuries or alcohol intake.   Diagnostic Data: MRI brain without contrast did not show any acute changes, there was mild diffuse atrophy and mild chronic microvascular disease. TSH and B12 normal.   PAST  MEDICAL HISTORY: Past Medical History:  Diagnosis Date  . Hyperlipemia   . Hypertension     MEDICATIONS: Current Outpatient Prescriptions on File Prior to Visit  Medication Sig Dispense Refill  . Multiple Vitamins-Minerals (ICAPS MV PO) Take by mouth daily.    . nortriptyline (PAMELOR) 10 MG capsule     . simvastatin (ZOCOR) 20 MG tablet Take 20 mg by mouth daily.    Marland Kitchen aspirin 81 MG tablet Take 81 mg by mouth daily.    . fenofibrate 160 MG tablet Take 160 mg by mouth daily.    . NON FORMULARY EHT age defying supplement; Take 2 tablets daily    . rivastigmine (EXELON) 4.6 mg/24hr Place 1 patch (4.6 mg total) onto the skin daily. (Patient not taking: Reported on 11/19/2016) 30 patch 6  Donepezil 10mg  daily No current facility-administered medications on file prior to visit.     ALLERGIES: No Known Allergies  FAMILY HISTORY: Family History  Problem Relation Age of Onset  . Heart disease Father   . Heart disease Mother   . Stroke Sister     SOCIAL HISTORY: Social History   Social History  . Marital status: Married    Spouse name: N/A  . Number of children: 4  . Years of education: N/A   Occupational History  . Not on file.   Social History Main Topics  . Smoking status: Never Smoker  . Smokeless tobacco: Never Used  . Alcohol use No  . Drug use: No  . Sexual activity: Not on file   Other Topics Concern  . Not on file   Social History Narrative  . No narrative on file    REVIEW OF SYSTEMS: Constitutional: No fevers, chills, or sweats, no generalized fatigue, change in appetite Eyes: No visual changes, double vision, eye pain Ear, nose and throat: No hearing loss, ear pain, nasal congestion, sore throat Cardiovascular: No chest pain, palpitations Respiratory:  No shortness of breath at rest or with exertion, wheezes GastrointestinaI: No nausea, vomiting, diarrhea, abdominal pain, fecal incontinence Genitourinary:  No dysuria, urinary retention or  frequency Musculoskeletal:  No neck pain, back pain Integumentary: No rash, pruritus, skin lesions Neurological: as above Psychiatric: No depression, insomnia, anxiety Endocrine: No palpitations, fatigue, diaphoresis, mood swings, change in appetite, change in weight, increased thirst Hematologic/Lymphatic:  No anemia, purpura, petechiae. Allergic/Immunologic: no itchy/runny eyes, nasal congestion, recent allergic reactions, rashes  PHYSICAL EXAM: Vitals:   11/19/16 1127  BP: 128/86  Pulse: 72  Resp: 16  Temp: 98.3 F (36.8 C)   General: No acute distress Head:  Normocephalic/atraumatic Neck: supple, no paraspinal tenderness Heart:  Regular rate and rhythm Lungs:  Clear to auscultation bilaterally Back: No paraspinal tenderness Skin/Extremities: No rash, no edema Neurological Exam: alert and oriented to person, place, and time. No aphasia or dysarthria. Fund of knowledge is appropriate.  Recent and remote memory are intact.  Attention and concentration are normal.    Able to name objects and repeat phrases. CDT 4/5 MMSE - Mini Mental State Exam 11/19/2016 05/16/2016 09/25/2015  Orientation to time 5  5 5  Orientation to Place 5 5 5   Registration 3 3 3   Attention/ Calculation 2 5 5   Recall 1 3 0  Language- name 2 objects 2 2 2   Language- repeat 1 1 1   Language- follow 3 step command 3 2 3   Language- read & follow direction 1 1 1   Write a sentence 1 1 1   Copy design 0 0 0  Total score 24 28 26    Cranial nerves: Pupils equal, round, reactive to light.  Extraocular movements intact with no nystagmus. Visual fields full. Facial sensation intact. No facial asymmetry. Tongue, uvula, palate midline.  Motor: Bulk and tone normal, muscle strength 5/5 throughout with no pronator drift.  Sensation to light touch intact.  No extinction to double simultaneous stimulation.  Deep tendon reflexes 2+ throughout, toes downgoing.  Finger to nose testing intact.  Gait narrow-based and steady, able to  tandem walk adequately.  Romberg negative.  IMPRESSION: This is a 76 yo RH woman with a history of hypertension, hyperlipidemia, with worsening memory. MMSE today 24/30 (28/30 in September 2017, 26/30 in February 2017). There has been a decline since her last visit, she has been taking Aricept 10mg  daily. Namenda will be added on, side effects were discussed. Family noted that she would have good and bad days, better if with good sleep, worse when she was dehydrated one time, we discussed this is typical of dementia. We again discussed the importance of control of vascular risk factors, physical exercise, and brain stimulation exercises for brain health. She will follow-up in 6 months and knows to call for any changes  Thank you for allowing me to participate in her care.  Please do not hesitate to call for any questions or concerns.  The duration of this appointment visit was 25 minutes of face-to-face time with the patient.  Greater than 50% of this time was spent in counseling, explanation of diagnosis, planning of further management, and coordination of care.   Ellouise Newer, M.D.   CC: Dr. Lin Landsman

## 2016-11-19 NOTE — Patient Instructions (Signed)
1. Start Namenda 10mg : take 1 tablet daily for 1 week, then increase to 1 tablet twice a day 2. Continue Aricept 10mg  daily 3. Control of blood pressure, cholesterol, as well as physical exercise and brain stimulation exercises are important for brain health 4. Follow-up in 6 months, call for any changes

## 2016-11-20 NOTE — Progress Notes (Signed)
Forward office note to PCP.

## 2016-11-23 IMAGING — MR MR HEAD W/O CM
9 of 11 series · 34 of 48 positions shown · non-contrast
Comparison: None.

CLINICAL DATA: Mild dementia.

EXAM:
MRI HEAD WITHOUT CONTRAST
TECHNIQUE: Multiplanar, multiecho pulse sequences of the brain and surrounding
structures were obtained without intravenous contrast.

[Series 2: FLAIR · sagittal · 5.0mm · 0.47mm/px · 1 of 23 slices shown (1 of 2)]
[im 1/23]
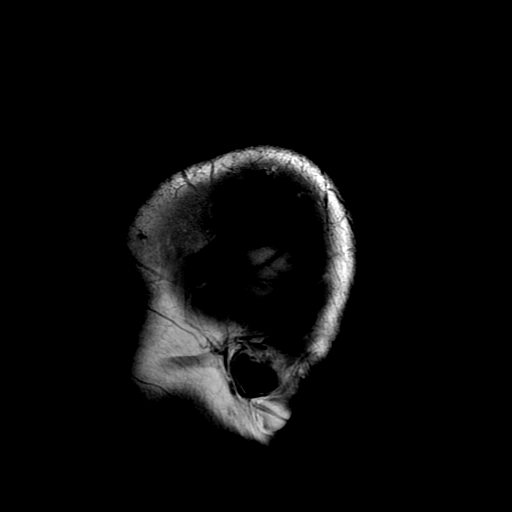

[Series 4: DWI · axial · 3.6mm · 0.94mm/px · z∈[-113,+27]mm · 8 of 80 slices shown (1 of 4)]
[im 1/80]
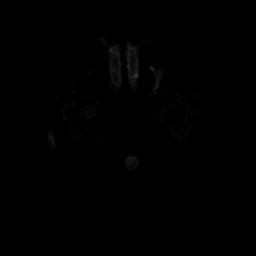
[im 12/80]
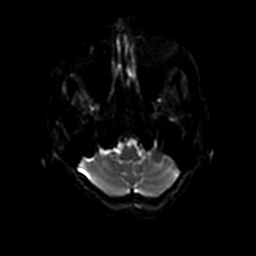
[im 23/80]
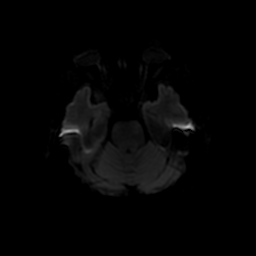
[im 34/80]
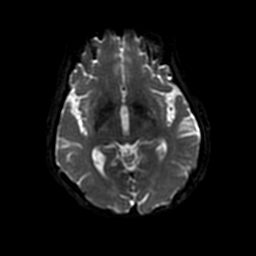
[im 46/80]
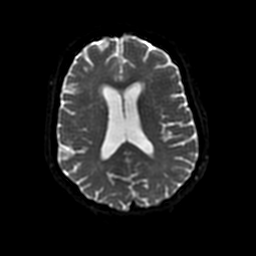
[im 57/80]
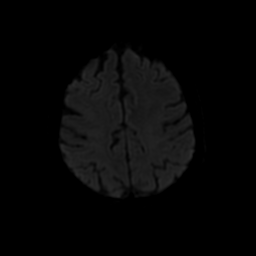
[im 68/80]
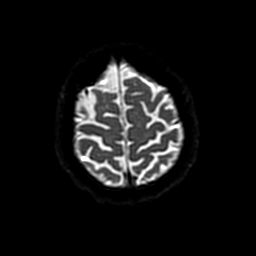
[im 80/80]
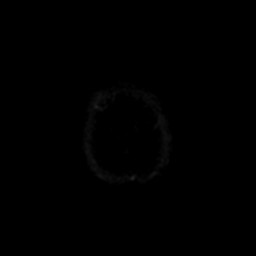

[Series 5: T2 · axial · 5.0mm · 0.47mm/px · z∈[-134,+14]mm · 3 of 26 slices shown]
[im 1/26]
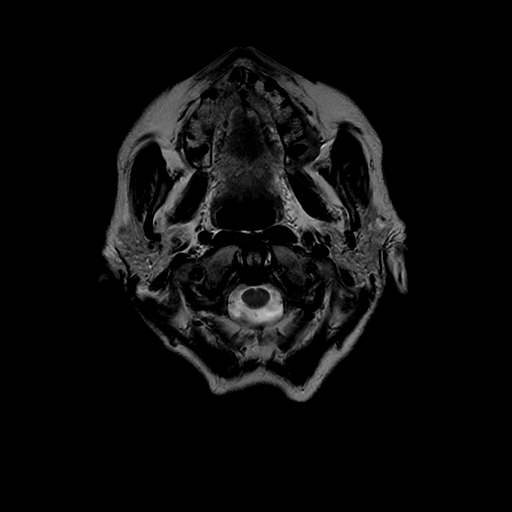
[im 13/26]
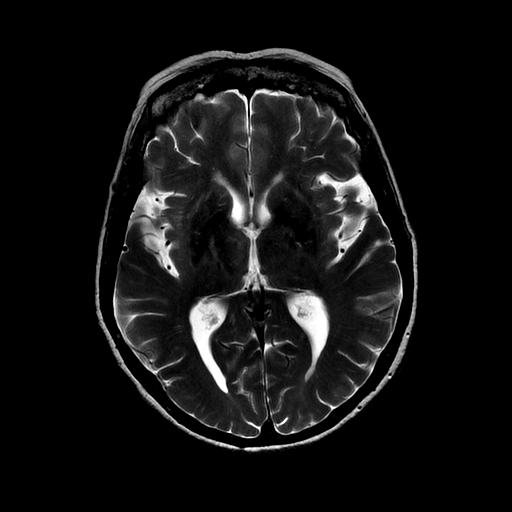
[im 26/26]
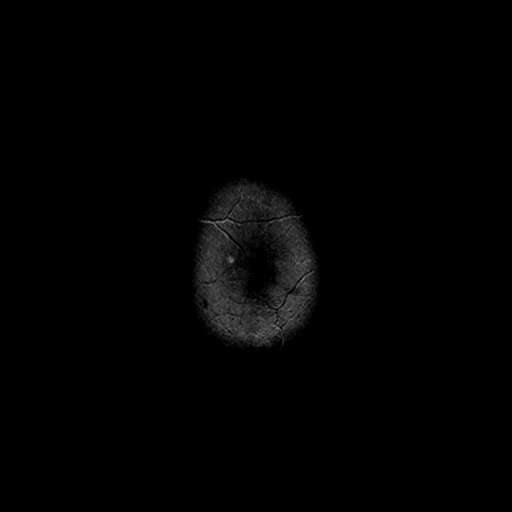

[Series 6: FLAIR · axial · 5.0mm · 0.47mm/px · z∈[-126,+23]mm · 3 of 26 slices shown (2 of 2)]
[im 1/26]
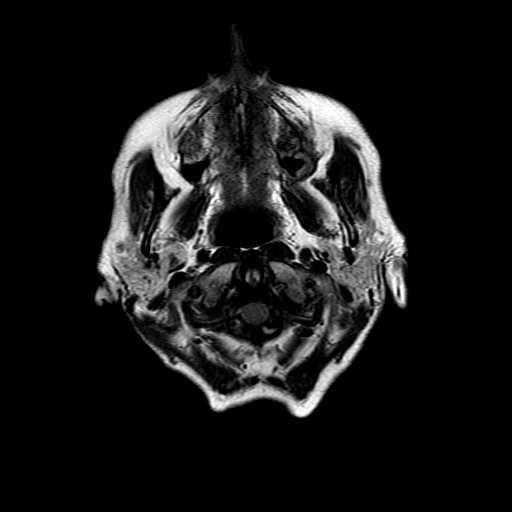
[im 13/26]
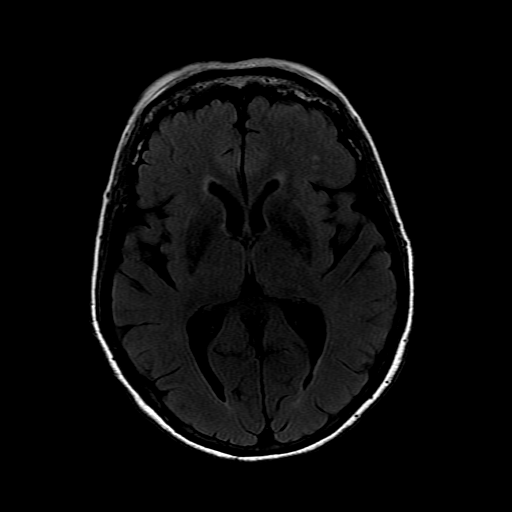
[im 26/26]
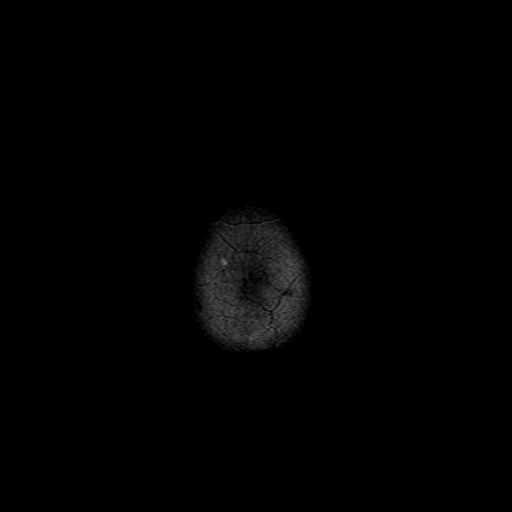

[Series 7: DWI · coronal · 5.0mm · 0.94mm/px · 6 of 66 slices shown (2 of 4)]
[im 1/66]
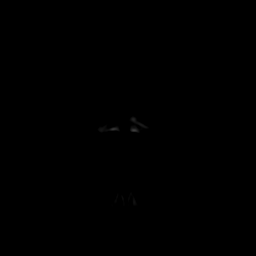
[im 14/66]
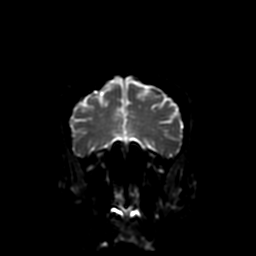
[im 27/66]
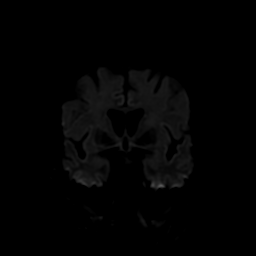
[im 40/66]
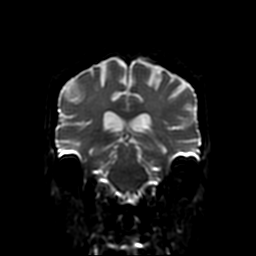
[im 53/66]
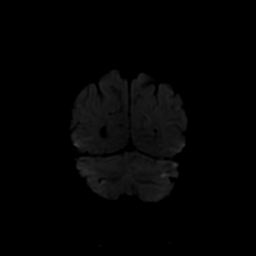
[im 66/66]
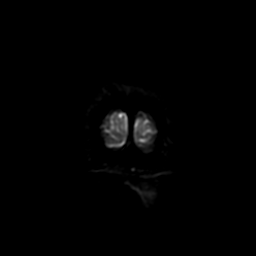

[Series 8: (person_name) · axial · 3.0mm · 0.47mm/px · z∈[-116,-67]mm · 3 of 100 slices shown]
[im 1/100]
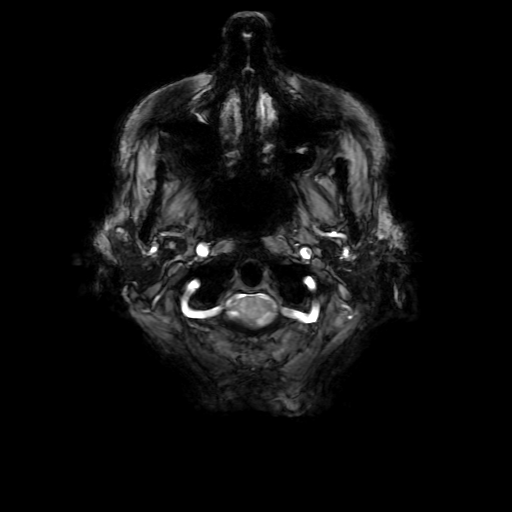
[im 12/100]
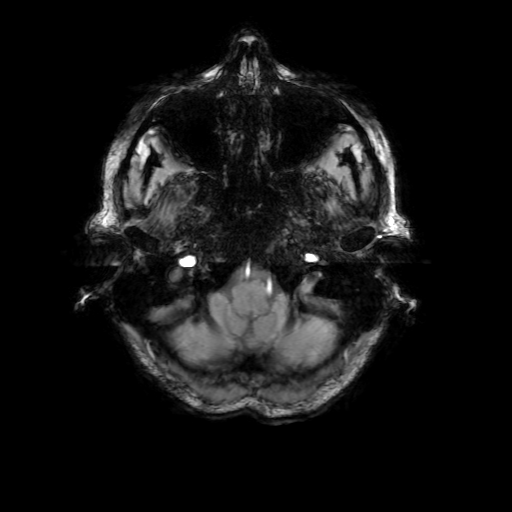
[im 34/100]
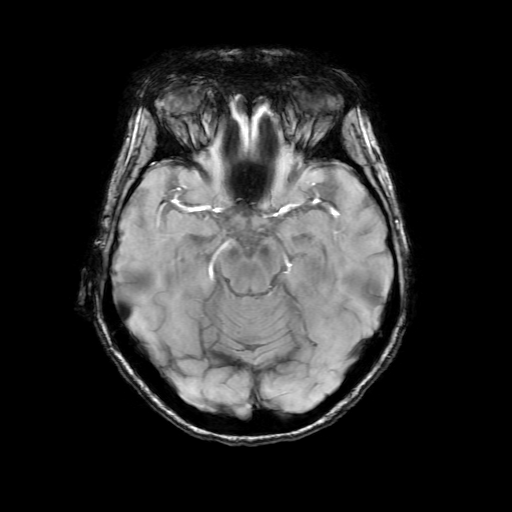

[Series 10: T2 post-contrast · coronal · 5.0mm · 0.39mm/px · 3 of 28 slices shown]
[im 1/28]
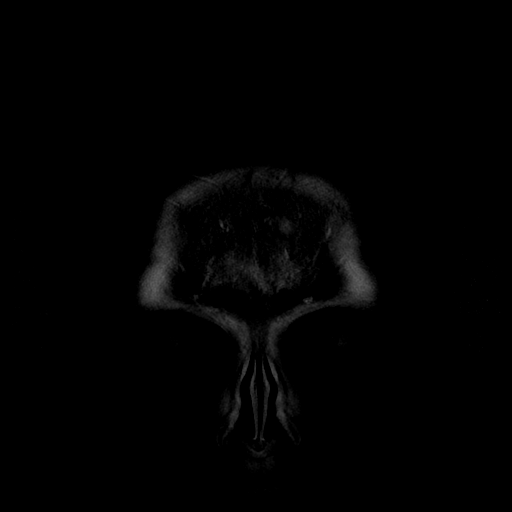
[im 14/28]
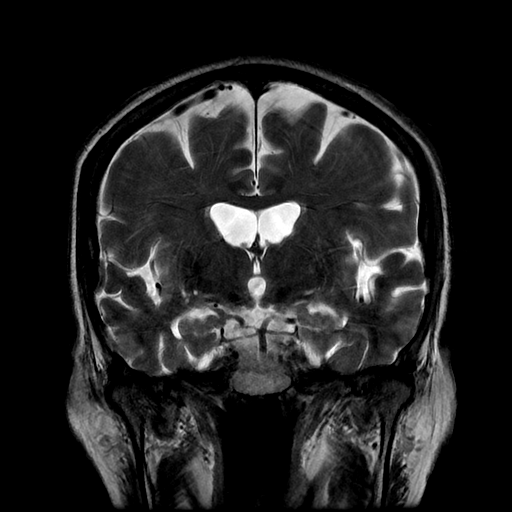
[im 28/28]
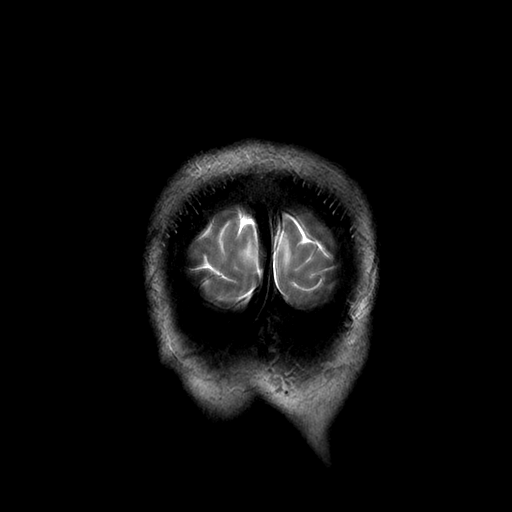

[Series 400: DWI · axial · 3.6mm · 0.94mm/px · z∈[-113,+27]mm · 4 of 40 slices shown (3 of 4)]
[im 1/40]
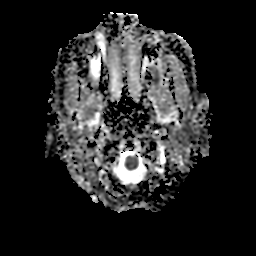
[im 14/40]
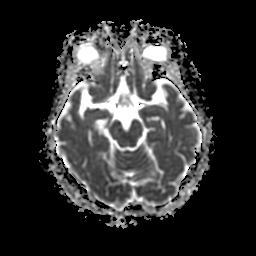
[im 27/40]
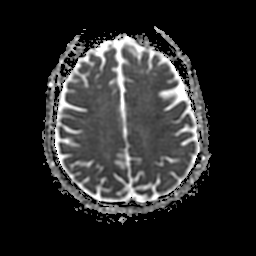
[im 40/40]
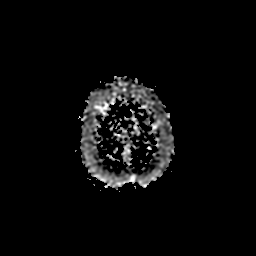

[Series 700: DWI · coronal · 5.0mm · 0.94mm/px · 3 of 33 slices shown (4 of 4)]
[im 1/33]
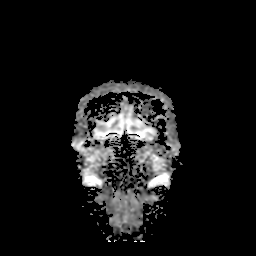
[im 17/33]
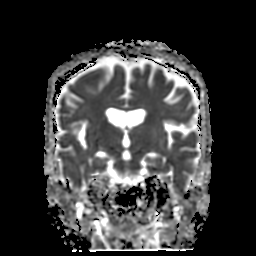
[im 33/33]
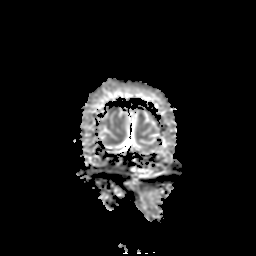

[34 of 48 positions shown; findings below may reference images not displayed]

FINDINGS: There is no evidence of acute infarct, intracranial hemorrhage,
mass, midline shift, or extra-axial fluid collection. Mild
generalized cerebral atrophy is within normal limits for age. Small
foci of T2 hyperintensity scattered throughout the subcortical and
deep cerebral white matter bilaterally are nonspecific but
compatible with mild chronic small vessel ischemic disease. 1 or 2
small chronic infarcts are noted in the left cerebellum.

Prior right cataract extraction is noted. Small mucous retention
cysts are present in the right maxillary sinus. No significant
mastoid disease. Major intracranial vascular flow voids are
preserved.
IMPRESSION: 1. No acute intracranial abnormality or mass.
2. Mild chronic small vessel ischemic disease.

## 2016-11-28 DIAGNOSIS — Z6820 Body mass index (BMI) 20.0-20.9, adult: Secondary | ICD-10-CM | POA: Diagnosis not present

## 2016-11-28 DIAGNOSIS — Z0000 Encounter for general adult medical examination without abnormal findings: Secondary | ICD-10-CM | POA: Diagnosis not present

## 2016-11-28 DIAGNOSIS — S61219A Laceration without foreign body of unspecified finger without damage to nail, initial encounter: Secondary | ICD-10-CM | POA: Diagnosis not present

## 2016-11-28 DIAGNOSIS — Z23 Encounter for immunization: Secondary | ICD-10-CM | POA: Diagnosis not present

## 2017-03-31 DIAGNOSIS — R69 Illness, unspecified: Secondary | ICD-10-CM | POA: Diagnosis not present

## 2017-03-31 DIAGNOSIS — I701 Atherosclerosis of renal artery: Secondary | ICD-10-CM | POA: Diagnosis not present

## 2017-03-31 DIAGNOSIS — E785 Hyperlipidemia, unspecified: Secondary | ICD-10-CM | POA: Diagnosis not present

## 2017-03-31 DIAGNOSIS — N183 Chronic kidney disease, stage 3 (moderate): Secondary | ICD-10-CM | POA: Diagnosis not present

## 2017-03-31 DIAGNOSIS — R809 Proteinuria, unspecified: Secondary | ICD-10-CM | POA: Diagnosis not present

## 2017-05-17 DIAGNOSIS — S40811A Abrasion of right upper arm, initial encounter: Secondary | ICD-10-CM | POA: Diagnosis not present

## 2017-05-19 DIAGNOSIS — R69 Illness, unspecified: Secondary | ICD-10-CM | POA: Diagnosis not present

## 2017-05-29 ENCOUNTER — Encounter: Payer: Self-pay | Admitting: Neurology

## 2017-05-29 ENCOUNTER — Telehealth: Payer: Self-pay | Admitting: Neurology

## 2017-05-29 ENCOUNTER — Ambulatory Visit (INDEPENDENT_AMBULATORY_CARE_PROVIDER_SITE_OTHER): Payer: Medicare HMO | Admitting: Neurology

## 2017-05-29 VITALS — BP 184/98 | HR 72 | Ht 65.0 in | Wt 117.0 lb

## 2017-05-29 DIAGNOSIS — F0390 Unspecified dementia without behavioral disturbance: Secondary | ICD-10-CM

## 2017-05-29 DIAGNOSIS — R69 Illness, unspecified: Secondary | ICD-10-CM | POA: Diagnosis not present

## 2017-05-29 DIAGNOSIS — F03A0 Unspecified dementia, mild, without behavioral disturbance, psychotic disturbance, mood disturbance, and anxiety: Secondary | ICD-10-CM

## 2017-05-29 MED ORDER — MEMANTINE HCL 10 MG PO TABS: ORAL_TABLET | ORAL | 11 refills | Status: DC

## 2017-05-29 MED ORDER — DONEPEZIL HCL 10 MG PO TABS: ORAL_TABLET | ORAL | 11 refills | Status: DC

## 2017-05-29 NOTE — Telephone Encounter (Signed)
Rx's sent to pharmacy listed below

## 2017-05-29 NOTE — Patient Instructions (Signed)
1. Continue Aricept 10mg  daily 2. Continue Memantine 10mg  twice a day 3. Follow-up in 6 months, call for any changes  FALL PRECAUTIONS: Be cautious when walking. Scan the area for obstacles that may increase the risk of trips and falls. When getting up in the mornings, sit up at the edge of the bed for a few minutes before getting out of bed. Consider elevating the bed at the head end to avoid drop of blood pressure when getting up. Walk always in a well-lit room (use night lights in the walls). Avoid area rugs or power cords from appliances in the middle of the walkways. Use a walker or a cane if necessary and consider physical therapy for balance exercise. Get your eyesight checked regularly.  FINANCIAL OVERSIGHT: Supervision, especially oversight when making financial decisions or transactions is also recommended.  HOME SAFETY: Consider the safety of the kitchen when operating appliances like stoves, microwave oven, and blender. Consider having supervision and share cooking responsibilities until no longer able to participate in those. Accidents with firearms and other hazards in the house should be identified and addressed as well.  DRIVING: Regarding driving, in patients with progressive memory problems, driving will be impaired. We advise to have someone else do the driving if trouble finding directions or if minor accidents are reported. Independent driving assessment is available to determine safety of driving.  ABILITY TO BE LEFT ALONE: If patient is unable to contact 911 operator, consider using LifeLine, or when the need is there, arrange for someone to stay with patients. Smoking is a fire hazard, consider supervision or cessation. Risk of wandering should be assessed by caregiver and if detected at any point, supervision and safe proof recommendations should be instituted.  MEDICATION SUPERVISION: Inability to self-administer medication needs to be constantly addressed. Implement a  mechanism to ensure safe administration of the medications.  RECOMMENDATIONS FOR ALL PATIENTS WITH MEMORY PROBLEMS: 1. Continue to exercise (Recommend 30 minutes of walking everyday, or 3 hours every week) 2. Increase social interactions - continue going to [place] and enjoy social gatherings with friends and family 3. Eat healthy, avoid fried foods and eat more fruits and vegetables 4. Maintain adequate blood pressure, blood sugar, and blood cholesterol level. Reducing the risk of stroke and cardiovascular disease also helps promoting better memory. 5. Avoid stressful situations. Live a simple life and avoid aggravations. Organize your time and prepare for the next day in anticipation. 6. Sleep well, avoid any interruptions of sleep and avoid any distractions in the bedroom that may interfere with adequate sleep quality 7. Avoid sugar, avoid sweets as there is a strong link between excessive sugar intake, diabetes, and cognitive impairment We discussed the Mediterranean diet, which has been shown to help patients reduce the risk of progressive memory disorders and reduces cardiovascular risk. This includes eating fish, eat fruits and green leafy vegetables, nuts like almonds and hazelnuts, walnuts, and also use olive oil. Avoid fast foods and fried foods as much as possible. Avoid sweets and sugar as sugar use has been linked to worsening of memory function.  There is always a concern of gradual progression of memory problems. If this is the case, then we may need to adjust level of care according to patient needs. Support, both to the patient and caregiver, should then be put into place.

## 2017-05-29 NOTE — Telephone Encounter (Signed)
Pt left a voicemail message saying a prescription was called in to the wrong pharmacy and would like a call back regarding this

## 2017-05-29 NOTE — Telephone Encounter (Signed)
He called back to say that it needed to go to [facility] in [place]. Thanks

## 2017-05-29 NOTE — Progress Notes (Signed)
NEUROLOGY FOLLOW UP OFFICE NOTE  Misty Meadows [id number]  HISTORY OF PRESENT ILLNESS: I had the pleasure of seeing Misty Meadows in follow-up in the neurology clinic on 05/29/2017.  The patient was last seen 6 months ago for worsening memory suggestive of mild dementia. She is again accompanied by her son who helps supplement the history today.  MMSE 24/30 in April 2018 (28/30 in September 2017, 26/30 in February 2017). She is taking Aricept 10mg  daily. Namenda was added on last visit due to decline while on Aricept. She states she is taking Namenda 10mg  BID without side effects. Her husband ensures she takes her medications regularly. She does not drive. She misplaces her purse a lot. She needs help with dressing and bathing. Her husband told son that there has not been much change since her last visit. She repeats herself frequently. She fell last week in the bathroom with an abrasion on her right arm. She denied any dizziness or loss of consciousness.   HPI 09/25/2015: This is a 76 yo RH woman with a history of hypertension, hyperlipidemia, with worsening memory. She reports that her memory is "not good," she forgets names, sometimes of her own family members, "sometimes my mind just shuts down." She feels memory started changing in the past year, her husband started noticing some differences around 4 years ago, but her son noticed changes 6-8 years ago. 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 She was in charge of bill payments until last year when she asked her husband to do them because she could not do it anymore. He has been making sure she takes her medications over the past year. Family has convinced her not to drive, because as a passenger she would think they need to go the other way. She frequently misplaces things and is always looking for her pocketbook. She has  left the stove on. Her family is also concerned about personality changes, one son thinks she is paranoid that people are whispering about her and conspiring against her. Family reports that in her mind there is "another Edd Arbour" (her husband), they celebrated their 56th anniversary recently and she did not recognize him, or would say someone is driving his truck or says somebody is writing her checks (her granddaughter has been helping with this). Her mother had Alzheimer's disease. She denies any history of head injuries or alcohol intake.   She was having bad dreams on the Aricept and wanted to switch. She started Exelon but her husband called to report she was declining, asking where the bathroom was and how long she had been cooking in the kitchen. This caused her to become nauseated and vomit every night, with her BP going up. They requested to resume Aricept, then called back when she was off medication to report that she was up all night pacing and doing crazy things. Aricept was restarted and she again started having bad dreams, "getting into things," and husband felt it was "wearing off during the day. He called a week later to report it was not working because she would not recall who he was and she was not resting good. It was explained that this was likely due to underlying dementia rather than the medication, and she was switched to the Exelon patch in October 2017. She refused to try it and went back to taking Aricept.   Diagnostic  Data: MRI brain without contrast did not show any acute changes, there was mild diffuse atrophy and mild chronic microvascular disease. TSH and B12 normal.   PAST MEDICAL HISTORY: Past Medical History:  Diagnosis Date  . Hyperlipemia   . Hypertension     MEDICATIONS:  Outpatient Encounter Prescriptions as of 05/29/2017  Medication Sig Note  . aspirin 81 MG tablet Take 81 mg by mouth daily.   Marland Kitchen donepezil (ARICEPT) 10 MG tablet Take 1 tablet daily   .  fenofibrate 160 MG tablet Take 160 mg by mouth daily.   . memantine (NAMENDA) 10 MG tablet Take 1 tablet daily for 1 week, then increase to 1 tablet twice a day   . Multiple Vitamins-Minerals (ICAPS MV PO) Take by mouth daily.   . NON FORMULARY EHT age defying supplement; Take 2 tablets daily   . nortriptyline (PAMELOR) 10 MG capsule  05/16/2016: Received from: External Pharmacy  . rivastigmine (EXELON) 4.6 mg/24hr Place 1 patch (4.6 mg total) onto the skin daily. (Patient not taking: Reported on 11/19/2016)   . simvastatin (ZOCOR) 20 MG tablet Take 20 mg by mouth daily.   . traZODone (DESYREL) 50 MG tablet Take 50 mg by mouth daily.    No facility-administered encounter medications on file as of 05/29/2017.      ALLERGIES: No Known Allergies  FAMILY HISTORY: Family History  Problem Relation Age of Onset  . Heart disease Father   . Heart disease Mother   . Stroke Sister     SOCIAL HISTORY: Social History   Social History  . Marital status: Married    Spouse name: N/A  . Number of children: 4  . Years of education: N/A   Occupational History  . Not on file.   Social History Main Topics  . Smoking status: Never Smoker  . Smokeless tobacco: Never Used  . Alcohol use No  . Drug use: No  . Sexual activity: Not on file   Other Topics Concern  . Not on file   Social History Narrative  . No narrative on file    REVIEW OF SYSTEMS: Constitutional: No fevers, chills, or sweats, no generalized fatigue, change in appetite Eyes: No visual changes, double vision, eye pain Ear, nose and throat: No hearing loss, ear pain, nasal congestion, sore throat Cardiovascular: No chest pain, palpitations Respiratory:  No shortness of breath at rest or with exertion, wheezes GastrointestinaI: No nausea, vomiting, diarrhea, abdominal pain, fecal incontinence Genitourinary:  No dysuria, urinary retention or frequency Musculoskeletal:  No neck pain, back pain Integumentary: No rash,  pruritus, skin lesions Neurological: as above Psychiatric: No depression, insomnia, anxiety Endocrine: No palpitations, fatigue, diaphoresis, mood swings, change in appetite, change in weight, increased thirst Hematologic/Lymphatic:  No anemia, purpura, petechiae. Allergic/Immunologic: no itchy/runny eyes, nasal congestion, recent allergic reactions, rashes  PHYSICAL EXAM: Vitals:   05/29/17 1135 05/29/17 1146  BP: (!) 184/98 (!) 184/98  Pulse:  72  SpO2:  98%   General: No acute distress Head:  Normocephalic/atraumatic Neck: supple, no paraspinal tenderness Heart:  Regular rate and rhythm Lungs:  Clear to auscultation bilaterally Back: No paraspinal tenderness Skin/Extremities: No rash, no edema Neurological Exam: alert and oriented to person, place, and time. No aphasia or dysarthria. Fund of knowledge is appropriate.  Recent and remote memory are impaired. Attention and concentration are normal.    Able to name objects and repeat phrases. Cranial nerves: Pupils equal, round, reactive to light.  Extraocular movements intact with no nystagmus. Visual fields  full. Facial sensation intact. No facial asymmetry. Tongue, uvula, palate midline.  Motor: Bulk and tone normal, muscle strength 5/5 throughout with no pronator drift.  Sensation to light touch intact.  No extinction to double simultaneous stimulation.  Deep tendon reflexes 2+ throughout, toes downgoing.  Finger to nose testing intact.  Gait slow and cautious, no ataxia.  IMPRESSION: This is a 76 yo RH woman with a history of hypertension, hyperlipidemia, with mild dementia. MMSE in April 2018 was 24/30 (28/30 in September 2017, 26/30 in February 2017). She is taking Aricept 10mg  daily and Namenda 10mg  BID without side effects. Memory stable per family. We again discussed the importance of control of vascular risk factors, physical exercise, and brain stimulation exercises for brain health. We discussed home safety and continued  monitoring for any further need for help at home. She will follow-up in 6 months and knows to call for any changes  Thank you for allowing me to participate in her care.  Please do not hesitate to call for any questions or concerns.  The duration of this appointment visit was 15 minutes of face-to-face time with the patient.  Greater than 50% of this time was spent in counseling, explanation of diagnosis, planning of further management, and coordination of care.   Ellouise Newer, M.D.   CC: Dr. Lin Landsman

## 2017-06-16 DIAGNOSIS — R030 Elevated blood-pressure reading, without diagnosis of hypertension: Secondary | ICD-10-CM | POA: Diagnosis not present

## 2017-06-19 DIAGNOSIS — Z1331 Encounter for screening for depression: Secondary | ICD-10-CM | POA: Diagnosis not present

## 2017-06-19 DIAGNOSIS — R69 Illness, unspecified: Secondary | ICD-10-CM | POA: Diagnosis not present

## 2017-06-19 DIAGNOSIS — R030 Elevated blood-pressure reading, without diagnosis of hypertension: Secondary | ICD-10-CM | POA: Diagnosis not present

## 2017-06-19 DIAGNOSIS — M8580 Other specified disorders of bone density and structure, unspecified site: Secondary | ICD-10-CM | POA: Diagnosis not present

## 2017-06-19 DIAGNOSIS — Z1339 Encounter for screening examination for other mental health and behavioral disorders: Secondary | ICD-10-CM | POA: Diagnosis not present

## 2017-06-19 DIAGNOSIS — Z681 Body mass index (BMI) 19 or less, adult: Secondary | ICD-10-CM | POA: Diagnosis not present

## 2017-09-29 DIAGNOSIS — Z1231 Encounter for screening mammogram for malignant neoplasm of breast: Secondary | ICD-10-CM | POA: Diagnosis not present

## 2017-10-09 DIAGNOSIS — J069 Acute upper respiratory infection, unspecified: Secondary | ICD-10-CM | POA: Diagnosis not present

## 2017-10-26 DIAGNOSIS — R05 Cough: Secondary | ICD-10-CM | POA: Diagnosis not present

## 2017-11-24 ENCOUNTER — Ambulatory Visit: Payer: Self-pay | Admitting: Neurology

## 2017-11-25 ENCOUNTER — Ambulatory Visit: Payer: Medicare HMO | Admitting: Neurology

## 2017-11-25 ENCOUNTER — Encounter: Payer: Self-pay | Admitting: Neurology

## 2017-11-25 VITALS — BP 118/80 | HR 74 | Ht 65.0 in | Wt 118.4 lb

## 2017-11-25 DIAGNOSIS — F0390 Unspecified dementia without behavioral disturbance: Secondary | ICD-10-CM | POA: Diagnosis not present

## 2017-11-25 DIAGNOSIS — R69 Illness, unspecified: Secondary | ICD-10-CM | POA: Diagnosis not present

## 2017-11-25 DIAGNOSIS — F03A0 Unspecified dementia, mild, without behavioral disturbance, psychotic disturbance, mood disturbance, and anxiety: Secondary | ICD-10-CM

## 2017-11-25 NOTE — Patient Instructions (Signed)
1. Continue Aricept and Namenda  2. Look into day programs 3. Discuss appetite with Dr. Lin Landsman 4. Follow-up in 6 months, call for any changes  FALL PRECAUTIONS: Be cautious when walking. Scan the area for obstacles that may increase the risk of trips and falls. When getting up in the mornings, sit up at the edge of the bed for a few minutes before getting out of bed. Consider elevating the bed at the head end to avoid drop of blood pressure when getting up. Walk always in a well-lit room (use night lights in the walls). Avoid area rugs or power cords from appliances in the middle of the walkways. Use a walker or a cane if necessary and consider physical therapy for balance exercise. Get your eyesight checked regularly.  FINANCIAL OVERSIGHT: Supervision, especially oversight when making financial decisions or transactions is also recommended.  HOME SAFETY: Consider the safety of the kitchen when operating appliances like stoves, microwave oven, and blender. Consider having supervision and share cooking responsibilities until no longer able to participate in those. Accidents with firearms and other hazards in the house should be identified and addressed as well.  DRIVING: Regarding driving, in patients with progressive memory problems, driving will be impaired. We advise to have someone else do the driving if trouble finding directions or if minor accidents are reported. Independent driving assessment is available to determine safety of driving.  ABILITY TO BE LEFT ALONE: If patient is unable to contact 911 operator, consider using LifeLine, or when the need is there, arrange for someone to stay with patients. Smoking is a fire hazard, consider supervision or cessation. Risk of wandering should be assessed by caregiver and if detected at any point, supervision and safe proof recommendations should be instituted.  MEDICATION SUPERVISION: Inability to self-administer medication needs to be constantly  addressed. Implement a mechanism to ensure safe administration of the medications.  RECOMMENDATIONS FOR ALL PATIENTS WITH MEMORY PROBLEMS: 1. Continue to exercise (Recommend 30 minutes of walking everyday, or 3 hours every week) 2. Increase social interactions - continue going to [place] and enjoy social gatherings with friends and family 3. Eat healthy, avoid fried foods and eat more fruits and vegetables 4. Maintain adequate blood pressure, blood sugar, and blood cholesterol level. Reducing the risk of stroke and cardiovascular disease also helps promoting better memory. 5. Avoid stressful situations. Live a simple life and avoid aggravations. Organize your time and prepare for the next day in anticipation. 6. Sleep well, avoid any interruptions of sleep and avoid any distractions in the bedroom that may interfere with adequate sleep quality 7. Avoid sugar, avoid sweets as there is a strong link between excessive sugar intake, diabetes, and cognitive impairment The Mediterranean diet has been shown to help patients reduce the risk of progressive memory disorders and reduces cardiovascular risk. This includes eating fish, eat fruits and green leafy vegetables, nuts like almonds and hazelnuts, walnuts, and also use olive oil. Avoid fast foods and fried foods as much as possible. Avoid sweets and sugar as sugar use has been linked to worsening of memory function.  There is always a concern of gradual progression of memory problems. If this is the case, then we may need to adjust level of care according to patient needs. Support, both to the patient and caregiver, should then be put into place.

## 2017-11-25 NOTE — Progress Notes (Signed)
NEUROLOGY FOLLOW UP OFFICE NOTE  Misty Meadows [id number]  DOB: 1941-05-25  HISTORY OF PRESENT ILLNESS: I had the pleasure of seeing Misty Meadows in follow-up in the neurology clinic on 11/25/2017.  The patient was last seen 6 months ago for mild dementia. She is again accompanied by her husband and son who help supplement the history today.  MMSE 24/30 in April 2018 (28/30 in September 2017, 26/30 in February 2017). She is taking Aricept 10mg  daily and Namenda 10mg  BID without side effects. She reports her memory is not good. Her son notes worsening. Her husband reports she has a lot of trouble remembering, her other son from [place] came down for her birthday and she could not recall who he was. Her husband manages finances and administers her medications. She does not drive. She is able to dress and bathe herself, but has put clothes on backward. She misplaces things frequently. Her husband reports she gets irritated quickly, particularly when they encourage her to eat. Her son asks about medication to help with her appetite. She gets aggravated when he wants her to eat. No hallucinations. Sleep is good, she does not wander. She denies any headaches, dizziness, vision changes, focal numbness/tingling/weakness, no falls.   HPI 09/25/2015: This is a 77 yo RH woman with a history of hypertension, hyperlipidemia, with worsening memory. She reports that her memory is "not good," she forgets names, sometimes of her own family members, "sometimes my mind just shuts down." She feels memory started changing in the past year, her husband started noticing some differences around 4 years ago, but her son noticed changes 6-8 years ago. 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 She was in charge of bill payments until last year when she asked her husband to do them because she  could not do it anymore. He has been making sure she takes her medications over the past year. Family has convinced her not to drive, because as a passenger she would think they need to go the other way. She frequently misplaces things and is always looking for her pocketbook. She has left the stove on. Her family is also concerned about personality changes, one son thinks she is paranoid that people are whispering about her and conspiring against her. Family reports that in her mind there is "another Edd Arbour" (her husband), they celebrated their 56th anniversary recently and she did not recognize him, or would say someone is driving his truck or says somebody is writing her checks (her granddaughter has been helping with this). Her mother had Alzheimer's disease. She denies any history of head injuries or alcohol intake.   She was having bad dreams on the Aricept and wanted to switch. She started Exelon but her husband called to report she was declining, asking where the bathroom was and how long she had been cooking in the kitchen. This caused her to become nauseated and vomit every night, with her BP going up. They requested to resume Aricept, then called back when she was off medication to report that she was up all night pacing and doing crazy things. Aricept was restarted and she again started having bad dreams, "getting into things," and husband felt it was "wearing off during the day. He called a week later to report it was not working because she would not recall who he was and she was not  resting good. It was explained that this was likely due to underlying dementia rather than the medication, and she was switched to the Exelon patch in October 2017. She refused to try it and went back to taking Aricept.   Diagnostic Data: MRI brain without contrast did not show any acute changes, there was mild diffuse atrophy and mild chronic microvascular disease. TSH and B12 normal.   PAST MEDICAL HISTORY: Past  Medical History:  Diagnosis Date  . Hyperlipemia   . Hypertension     MEDICATIONS:  Outpatient Encounter Medications as of 11/25/2017  Medication Sig Note  . aspirin 81 MG tablet Take 81 mg by mouth daily.   Marland Kitchen donepezil (ARICEPT) 10 MG tablet Take 1 tablet daily   . memantine (NAMENDA) 10 MG tablet Take 1 tablet twice a day   . Multiple Vitamins-Minerals (ICAPS MV PO) Take by mouth daily.   . NON FORMULARY EHT age defying supplement; Take 2 tablets daily   . nortriptyline (PAMELOR) 10 MG capsule  05/16/2016: Received from: External Pharmacy  . simvastatin (ZOCOR) 20 MG tablet Take 20 mg by mouth daily.   . traZODone (DESYREL) 50 MG tablet Take 50 mg by mouth daily.    No facility-administered encounter medications on file as of 11/25/2017.      ALLERGIES: No Known Allergies  FAMILY HISTORY: Family History  Problem Relation Age of Onset  . Heart disease Father   . Heart disease Mother   . Stroke Sister     SOCIAL HISTORY: Social History   Socioeconomic History  . Marital status: Married    Spouse name: Not on file  . Number of children: 4  . Years of education: Not on file  . Highest education level: Not on file  Occupational History  . Not on file  Social Needs  . Financial resource strain: Not on file  . Food insecurity:    Worry: Not on file    Inability: Not on file  . Transportation needs:    Medical: Not on file    Non-medical: Not on file  Tobacco Use  . Smoking status: Never Smoker  . Smokeless tobacco: Never Used  Substance and Sexual Activity  . Alcohol use: No    Alcohol/week: 0.0 oz  . Drug use: No  . Sexual activity: Not on file  Lifestyle  . Physical activity:    Days per week: Not on file    Minutes per session: Not on file  . Stress: Not on file  Relationships  . Social connections:    Talks on phone: Not on file    Gets together: Not on file    Attends religious service: Not on file    Active member of club or organization: Not on  file    Attends meetings of clubs or organizations: Not on file    Relationship status: Not on file  . Intimate partner violence:    Fear of current or ex partner: Not on file    Emotionally abused: Not on file    Physically abused: Not on file    Forced sexual activity: Not on file  Other Topics Concern  . Not on file  Social History Narrative  . Not on file    REVIEW OF SYSTEMS: Constitutional: No fevers, chills, or sweats, no generalized fatigue, change in appetite Eyes: No visual changes, double vision, eye pain Ear, nose and throat: No hearing loss, ear pain, nasal congestion, sore throat Cardiovascular: No chest pain, palpitations Respiratory:  No shortness of breath at rest or with exertion, wheezes GastrointestinaI: No nausea, vomiting, diarrhea, abdominal pain, fecal incontinence Genitourinary:  No dysuria, urinary retention or frequency Musculoskeletal:  No neck pain, back pain Integumentary: No rash, pruritus, skin lesions Neurological: as above Psychiatric: No depression, insomnia, anxiety Endocrine: No palpitations, fatigue, diaphoresis, mood swings, change in appetite, change in weight, increased thirst Hematologic/Lymphatic:  No anemia, purpura, petechiae. Allergic/Immunologic: no itchy/runny eyes, nasal congestion, recent allergic reactions, rashes  PHYSICAL EXAM: Vitals:   11/25/17 1152  BP: 118/80  Pulse: 74  SpO2: 100%   General: No acute distress Head:  Normocephalic/atraumatic Neck: supple, no paraspinal tenderness Heart:  Regular rate and rhythm Lungs:  Clear to auscultation bilaterally Back: No paraspinal tenderness Skin/Extremities: No rash, no edema Neurological Exam: alert and oriented to person, place, and month/day of week, season. Year is 2000-something. No aphasia or dysarthria. Fund of knowledge is appropriate.  Recent and remote memory are impaired. Attention and concentration are normal.    Able to name objects and repeat phrases. CDT  2/5 MMSE - Mini Mental State Exam 11/25/2017 05/29/2017 11/19/2016  Not completed: - Refused -  Orientation to time 3 - 5  Orientation to Place 4 - 5  Registration 3 - 3  Attention/ Calculation 3 - 2  Recall 0 - 1  Language- name 2 objects 2 - 2  Language- repeat 1 - 1  Language- follow 3 step command 2 - 3  Language- read & follow direction 1 - 1  Write a sentence 1 - 1  Copy design 0 - 0  Total score 20 - 24   Cranial nerves: Pupils equal, round, reactive to light.  Extraocular movements intact with no nystagmus. Visual fields full. Facial sensation intact. No facial asymmetry. Tongue, uvula, palate midline.  Motor: Bulk and tone normal, muscle strength 5/5 throughout with no pronator drift.  Sensation to light touch intact.  No extinction to double simultaneous stimulation.  Deep tendon reflexes 2+ throughout, toes downgoing.  Finger to nose testing intact.  Gait slow and cautious, no ataxia.  IMPRESSION: This is a 77 yo RH woman with a history of hypertension, hyperlipidemia, with mild to moderate dementia. MMSE today 20/30, showing decline from prior visits (24/30 in April 2018, 28/30 in September 2017, 26/30 in February 2017). She is taking Aricept 10mg  daily and Namenda 10mg  BID without side effects. We again discussed diagnosis and medications. We discussed mood changes that can occur with dementia. Her family is mostly concerned about her appetite and will discuss this with her PCP. We again discussed the importance of control of vascular risk factors, physical exercise, and brain stimulation exercises for brain health. We discussed home safety and looking into day programs. She will follow-up in 6 months and knows to call for any changes  Thank you for allowing me to participate in her care.  Please do not hesitate to call for any questions or concerns.  The duration of this appointment visit was 30 minutes of face-to-face time with the patient.  Greater than 50% of this time was spent  in counseling, explanation of diagnosis, planning of further management, and coordination of care.   Ellouise Newer, M.D.   CC: Dr. Lin Landsman

## 2017-12-10 DIAGNOSIS — E785 Hyperlipidemia, unspecified: Secondary | ICD-10-CM | POA: Diagnosis not present

## 2017-12-10 DIAGNOSIS — Z0000 Encounter for general adult medical examination without abnormal findings: Secondary | ICD-10-CM | POA: Diagnosis not present

## 2017-12-10 DIAGNOSIS — Z79899 Other long term (current) drug therapy: Secondary | ICD-10-CM | POA: Diagnosis not present

## 2017-12-10 DIAGNOSIS — Z681 Body mass index (BMI) 19 or less, adult: Secondary | ICD-10-CM | POA: Diagnosis not present

## 2017-12-10 DIAGNOSIS — M8580 Other specified disorders of bone density and structure, unspecified site: Secondary | ICD-10-CM | POA: Diagnosis not present

## 2017-12-10 DIAGNOSIS — N189 Chronic kidney disease, unspecified: Secondary | ICD-10-CM | POA: Diagnosis not present

## 2017-12-17 DIAGNOSIS — J189 Pneumonia, unspecified organism: Secondary | ICD-10-CM | POA: Diagnosis not present

## 2017-12-17 DIAGNOSIS — R5383 Other fatigue: Secondary | ICD-10-CM | POA: Diagnosis not present

## 2017-12-17 DIAGNOSIS — G4700 Insomnia, unspecified: Secondary | ICD-10-CM | POA: Diagnosis not present

## 2017-12-17 DIAGNOSIS — Z681 Body mass index (BMI) 19 or less, adult: Secondary | ICD-10-CM | POA: Diagnosis not present

## 2017-12-17 DIAGNOSIS — N390 Urinary tract infection, site not specified: Secondary | ICD-10-CM | POA: Diagnosis not present

## 2017-12-17 DIAGNOSIS — R5381 Other malaise: Secondary | ICD-10-CM | POA: Diagnosis not present

## 2018-05-06 DIAGNOSIS — Z23 Encounter for immunization: Secondary | ICD-10-CM | POA: Diagnosis not present

## 2018-05-26 DIAGNOSIS — I959 Hypotension, unspecified: Secondary | ICD-10-CM | POA: Diagnosis not present

## 2018-05-26 DIAGNOSIS — Z681 Body mass index (BMI) 19 or less, adult: Secondary | ICD-10-CM | POA: Diagnosis not present

## 2018-06-16 ENCOUNTER — Ambulatory Visit: Payer: Self-pay | Admitting: Neurology

## 2018-06-16 DIAGNOSIS — N189 Chronic kidney disease, unspecified: Secondary | ICD-10-CM | POA: Diagnosis not present

## 2018-06-16 DIAGNOSIS — R5381 Other malaise: Secondary | ICD-10-CM | POA: Diagnosis not present

## 2018-06-16 DIAGNOSIS — Z681 Body mass index (BMI) 19 or less, adult: Secondary | ICD-10-CM | POA: Diagnosis not present

## 2018-06-16 DIAGNOSIS — R69 Illness, unspecified: Secondary | ICD-10-CM | POA: Diagnosis not present

## 2018-06-16 DIAGNOSIS — G4700 Insomnia, unspecified: Secondary | ICD-10-CM | POA: Diagnosis not present

## 2018-06-16 DIAGNOSIS — N390 Urinary tract infection, site not specified: Secondary | ICD-10-CM | POA: Diagnosis not present

## 2018-06-16 DIAGNOSIS — R5383 Other fatigue: Secondary | ICD-10-CM | POA: Diagnosis not present

## 2018-06-23 ENCOUNTER — Other Ambulatory Visit: Payer: Self-pay | Admitting: Neurology

## 2018-06-23 DIAGNOSIS — F0390 Unspecified dementia without behavioral disturbance: Secondary | ICD-10-CM

## 2018-06-23 DIAGNOSIS — F03A0 Unspecified dementia, mild, without behavioral disturbance, psychotic disturbance, mood disturbance, and anxiety: Secondary | ICD-10-CM

## 2018-06-30 DIAGNOSIS — E871 Hypo-osmolality and hyponatremia: Secondary | ICD-10-CM | POA: Diagnosis not present

## 2018-07-14 DIAGNOSIS — N3090 Cystitis, unspecified without hematuria: Secondary | ICD-10-CM | POA: Diagnosis not present

## 2018-07-14 DIAGNOSIS — R69 Illness, unspecified: Secondary | ICD-10-CM | POA: Diagnosis not present

## 2018-07-14 DIAGNOSIS — N3000 Acute cystitis without hematuria: Secondary | ICD-10-CM | POA: Diagnosis not present

## 2018-07-26 DIAGNOSIS — Z681 Body mass index (BMI) 19 or less, adult: Secondary | ICD-10-CM | POA: Diagnosis not present

## 2018-07-26 DIAGNOSIS — N390 Urinary tract infection, site not specified: Secondary | ICD-10-CM | POA: Diagnosis not present

## 2018-07-26 DIAGNOSIS — R69 Illness, unspecified: Secondary | ICD-10-CM | POA: Diagnosis not present

## 2018-07-31 DIAGNOSIS — I10 Essential (primary) hypertension: Secondary | ICD-10-CM | POA: Diagnosis not present

## 2018-07-31 DIAGNOSIS — R69 Illness, unspecified: Secondary | ICD-10-CM | POA: Diagnosis not present

## 2018-07-31 DIAGNOSIS — Z79899 Other long term (current) drug therapy: Secondary | ICD-10-CM | POA: Diagnosis not present

## 2018-07-31 DIAGNOSIS — R531 Weakness: Secondary | ICD-10-CM | POA: Diagnosis not present

## 2018-08-04 DIAGNOSIS — R69 Illness, unspecified: Secondary | ICD-10-CM | POA: Diagnosis not present

## 2018-08-04 DIAGNOSIS — D510 Vitamin B12 deficiency anemia due to intrinsic factor deficiency: Secondary | ICD-10-CM | POA: Diagnosis not present

## 2018-08-04 DIAGNOSIS — Z681 Body mass index (BMI) 19 or less, adult: Secondary | ICD-10-CM | POA: Diagnosis not present

## 2018-08-04 DIAGNOSIS — I10 Essential (primary) hypertension: Secondary | ICD-10-CM | POA: Diagnosis not present

## 2018-08-04 DIAGNOSIS — Z87440 Personal history of urinary (tract) infections: Secondary | ICD-10-CM | POA: Diagnosis not present

## 2018-08-12 DIAGNOSIS — J984 Other disorders of lung: Secondary | ICD-10-CM | POA: Diagnosis not present

## 2018-08-12 DIAGNOSIS — R410 Disorientation, unspecified: Secondary | ICD-10-CM | POA: Diagnosis not present

## 2018-08-12 DIAGNOSIS — R69 Illness, unspecified: Secondary | ICD-10-CM | POA: Diagnosis not present

## 2018-08-16 DIAGNOSIS — Z681 Body mass index (BMI) 19 or less, adult: Secondary | ICD-10-CM | POA: Diagnosis not present

## 2018-08-16 DIAGNOSIS — R5383 Other fatigue: Secondary | ICD-10-CM | POA: Diagnosis not present

## 2018-08-16 DIAGNOSIS — R69 Illness, unspecified: Secondary | ICD-10-CM | POA: Diagnosis not present

## 2018-08-16 DIAGNOSIS — R358 Other polyuria: Secondary | ICD-10-CM | POA: Diagnosis not present

## 2018-09-10 DIAGNOSIS — N3021 Other chronic cystitis with hematuria: Secondary | ICD-10-CM | POA: Diagnosis not present

## 2018-09-10 DIAGNOSIS — N3000 Acute cystitis without hematuria: Secondary | ICD-10-CM | POA: Diagnosis not present

## 2018-10-18 DIAGNOSIS — R69 Illness, unspecified: Secondary | ICD-10-CM | POA: Diagnosis not present

## 2018-10-18 DIAGNOSIS — Z1331 Encounter for screening for depression: Secondary | ICD-10-CM | POA: Diagnosis not present

## 2018-10-18 DIAGNOSIS — Z681 Body mass index (BMI) 19 or less, adult: Secondary | ICD-10-CM | POA: Diagnosis not present

## 2018-10-18 DIAGNOSIS — K5900 Constipation, unspecified: Secondary | ICD-10-CM | POA: Diagnosis not present

## 2018-10-18 DIAGNOSIS — N390 Urinary tract infection, site not specified: Secondary | ICD-10-CM | POA: Diagnosis not present

## 2018-10-19 DIAGNOSIS — Z1231 Encounter for screening mammogram for malignant neoplasm of breast: Secondary | ICD-10-CM | POA: Diagnosis not present

## 2018-12-10 DIAGNOSIS — N3021 Other chronic cystitis with hematuria: Secondary | ICD-10-CM | POA: Diagnosis not present

## 2018-12-31 DIAGNOSIS — N390 Urinary tract infection, site not specified: Secondary | ICD-10-CM | POA: Diagnosis not present

## 2019-01-11 DIAGNOSIS — R300 Dysuria: Secondary | ICD-10-CM | POA: Diagnosis not present

## 2019-01-11 DIAGNOSIS — N390 Urinary tract infection, site not specified: Secondary | ICD-10-CM | POA: Diagnosis not present

## 2019-02-01 ENCOUNTER — Ambulatory Visit: Payer: Self-pay | Admitting: Neurology

## 2019-02-22 DIAGNOSIS — K529 Noninfective gastroenteritis and colitis, unspecified: Secondary | ICD-10-CM | POA: Diagnosis not present

## 2019-02-22 DIAGNOSIS — Z681 Body mass index (BMI) 19 or less, adult: Secondary | ICD-10-CM | POA: Diagnosis not present

## 2019-02-22 DIAGNOSIS — Z0000 Encounter for general adult medical examination without abnormal findings: Secondary | ICD-10-CM | POA: Diagnosis not present

## 2019-02-22 DIAGNOSIS — R630 Anorexia: Secondary | ICD-10-CM | POA: Diagnosis not present

## 2019-02-22 DIAGNOSIS — R69 Illness, unspecified: Secondary | ICD-10-CM | POA: Diagnosis not present

## 2019-03-01 DIAGNOSIS — L821 Other seborrheic keratosis: Secondary | ICD-10-CM | POA: Diagnosis not present

## 2019-03-01 DIAGNOSIS — D2239 Melanocytic nevi of other parts of face: Secondary | ICD-10-CM | POA: Diagnosis not present

## 2019-03-01 DIAGNOSIS — L700 Acne vulgaris: Secondary | ICD-10-CM | POA: Diagnosis not present

## 2019-03-28 DIAGNOSIS — K529 Noninfective gastroenteritis and colitis, unspecified: Secondary | ICD-10-CM | POA: Diagnosis not present

## 2019-04-07 DIAGNOSIS — Z1329 Encounter for screening for other suspected endocrine disorder: Secondary | ICD-10-CM | POA: Diagnosis not present

## 2019-04-07 DIAGNOSIS — R6889 Other general symptoms and signs: Secondary | ICD-10-CM | POA: Diagnosis not present

## 2019-04-07 DIAGNOSIS — R634 Abnormal weight loss: Secondary | ICD-10-CM | POA: Diagnosis not present

## 2019-04-07 DIAGNOSIS — R5382 Chronic fatigue, unspecified: Secondary | ICD-10-CM | POA: Diagnosis not present

## 2019-04-11 DIAGNOSIS — D510 Vitamin B12 deficiency anemia due to intrinsic factor deficiency: Secondary | ICD-10-CM | POA: Diagnosis not present

## 2019-04-11 DIAGNOSIS — G4700 Insomnia, unspecified: Secondary | ICD-10-CM | POA: Diagnosis not present

## 2019-04-11 DIAGNOSIS — G309 Alzheimer's disease, unspecified: Secondary | ICD-10-CM | POA: Diagnosis not present

## 2019-04-11 DIAGNOSIS — E559 Vitamin D deficiency, unspecified: Secondary | ICD-10-CM | POA: Diagnosis not present

## 2019-04-11 DIAGNOSIS — Z79899 Other long term (current) drug therapy: Secondary | ICD-10-CM | POA: Diagnosis not present

## 2019-04-11 DIAGNOSIS — D519 Vitamin B12 deficiency anemia, unspecified: Secondary | ICD-10-CM | POA: Diagnosis not present

## 2019-04-11 DIAGNOSIS — R69 Illness, unspecified: Secondary | ICD-10-CM | POA: Diagnosis not present

## 2019-04-11 DIAGNOSIS — I10 Essential (primary) hypertension: Secondary | ICD-10-CM | POA: Diagnosis not present

## 2019-04-11 DIAGNOSIS — Z681 Body mass index (BMI) 19 or less, adult: Secondary | ICD-10-CM | POA: Diagnosis not present

## 2019-04-11 DIAGNOSIS — A0472 Enterocolitis due to Clostridium difficile, not specified as recurrent: Secondary | ICD-10-CM | POA: Diagnosis not present

## 2019-05-20 DIAGNOSIS — Z23 Encounter for immunization: Secondary | ICD-10-CM | POA: Diagnosis not present

## 2019-05-28 DIAGNOSIS — N3001 Acute cystitis with hematuria: Secondary | ICD-10-CM | POA: Diagnosis not present

## 2019-05-28 DIAGNOSIS — R358 Other polyuria: Secondary | ICD-10-CM | POA: Diagnosis not present

## 2019-05-30 DIAGNOSIS — R296 Repeated falls: Secondary | ICD-10-CM | POA: Diagnosis not present

## 2019-05-30 DIAGNOSIS — Z5329 Procedure and treatment not carried out because of patient's decision for other reasons: Secondary | ICD-10-CM | POA: Diagnosis not present

## 2019-05-31 DIAGNOSIS — N390 Urinary tract infection, site not specified: Secondary | ICD-10-CM | POA: Diagnosis not present

## 2019-05-31 DIAGNOSIS — R69 Illness, unspecified: Secondary | ICD-10-CM | POA: Diagnosis not present

## 2019-05-31 DIAGNOSIS — Z681 Body mass index (BMI) 19 or less, adult: Secondary | ICD-10-CM | POA: Diagnosis not present

## 2019-05-31 DIAGNOSIS — R197 Diarrhea, unspecified: Secondary | ICD-10-CM | POA: Diagnosis not present

## 2019-06-06 DIAGNOSIS — N390 Urinary tract infection, site not specified: Secondary | ICD-10-CM | POA: Diagnosis not present

## 2019-06-10 DIAGNOSIS — N3021 Other chronic cystitis with hematuria: Secondary | ICD-10-CM | POA: Diagnosis not present

## 2019-07-04 ENCOUNTER — Other Ambulatory Visit: Payer: Self-pay | Admitting: Neurology

## 2019-07-04 DIAGNOSIS — F0390 Unspecified dementia without behavioral disturbance: Secondary | ICD-10-CM

## 2019-07-04 DIAGNOSIS — F03A0 Unspecified dementia, mild, without behavioral disturbance, psychotic disturbance, mood disturbance, and anxiety: Secondary | ICD-10-CM

## 2019-07-12 ENCOUNTER — Other Ambulatory Visit: Payer: Self-pay | Admitting: Neurology

## 2019-07-12 ENCOUNTER — Other Ambulatory Visit: Payer: Self-pay

## 2019-07-12 ENCOUNTER — Telehealth: Payer: Self-pay | Admitting: Neurology

## 2019-07-12 DIAGNOSIS — F0390 Unspecified dementia without behavioral disturbance: Secondary | ICD-10-CM

## 2019-07-12 DIAGNOSIS — F03A0 Unspecified dementia, mild, without behavioral disturbance, psychotic disturbance, mood disturbance, and anxiety: Secondary | ICD-10-CM

## 2019-07-12 MED ORDER — MEMANTINE HCL 10 MG PO TABS: ORAL_TABLET | ORAL | 3 refills | Status: DC

## 2019-07-12 MED ORDER — DONEPEZIL HCL 10 MG PO TABS: ORAL_TABLET | ORAL | 3 refills | Status: DC

## 2019-07-12 NOTE — Telephone Encounter (Signed)
Patient husband called in that refill was denied at pharm. Patient is scheduled for a telephone visit on Monday 11/30. She is needing the donepezil 10mg  and memantine 10mg  to the [facility] in [place] please. She is completely out of the medication per Husband. Thanks!

## 2019-07-18 ENCOUNTER — Encounter: Payer: Self-pay | Admitting: Neurology

## 2019-07-18 ENCOUNTER — Telehealth (INDEPENDENT_AMBULATORY_CARE_PROVIDER_SITE_OTHER): Payer: Medicare HMO | Admitting: Neurology

## 2019-07-18 ENCOUNTER — Other Ambulatory Visit: Payer: Self-pay

## 2019-07-18 VITALS — Ht 63.0 in | Wt 92.0 lb

## 2019-07-18 DIAGNOSIS — F0390 Unspecified dementia without behavioral disturbance: Secondary | ICD-10-CM | POA: Diagnosis not present

## 2019-07-18 DIAGNOSIS — F03C0 Unspecified dementia, severe, without behavioral disturbance, psychotic disturbance, mood disturbance, and anxiety: Secondary | ICD-10-CM

## 2019-07-18 DIAGNOSIS — R69 Illness, unspecified: Secondary | ICD-10-CM | POA: Diagnosis not present

## 2019-07-18 MED ORDER — DIVALPROEX SODIUM 125 MG PO DR TAB: DELAYED_RELEASE_TABLET | ORAL | 11 refills | Status: AC

## 2019-07-18 MED ORDER — MEMANTINE HCL 10 MG PO TABS: ORAL_TABLET | ORAL | 3 refills | Status: AC

## 2019-07-18 MED ORDER — DONEPEZIL HCL 10 MG PO TABS: ORAL_TABLET | ORAL | 3 refills | Status: AC

## 2019-07-18 NOTE — Progress Notes (Signed)
Virtual Visit via Telephone Note The purpose of this virtual visit is to provide medical care while limiting exposure to the novel coronavirus.    Consent was obtained from husband for phone visit:  Yes.   Answered questions that patient had about telehealth interaction:  Yes.   I discussed the limitations, risks, security and privacy concerns of performing an evaluation and management service by telephone. I also discussed with the patient that there may be a patient responsible charge related to this service. The patient expressed understanding and agreed to proceed.  Pt location: Home Physician Location: office Name of referring provider:  Angelina Sheriff, MD I connected with .[place] [place] at patients husband's initiation/request on 07/18/2019 at 11:00 AM EST by telephone and verified that I am speaking with the correct person using two identifiers.  Pt MRN:  [id number] Pt DOB:  06-23-1941   History of Present Illness:  The patient had a telephone visit on 07/18/2019. Her husband provides the history as she is now unable to carry on conversations. She was last seen in the neurology clinic in April 2019, at that time MMSE was 20/30 (24/30 in April 2018, 28/30 in September 2017, 26/30 in February 2017). She is on Donepezil 10mg  daily and Memantine 10mg  BID without side effects. Her husband administers medications. Her husband states she cannot carry on conversations anymore. She is having more behavioral issues, disagreeing with everything family tries to do. Her husband bathes her and assists her to the bathroom, it would be a fight. She can indicate her needs, but once in the bathroom, she cannot pull her pants down, and resent it when her husband helps her. Some days are no trouble at all. She can feed herself a little, she would start eating then stop, her husband tries to feed her and she ignores him and looks the other way. Their granddaughter lives with them and helps as well. There  are some hallucinations, she asks what is there when there is nothing her husband sees, or she picks up things from the floor that are not present. She talks a lot in her sleep. Her husband gives her 1 tablet of Xanax very night, and occasionally gives an AM dose.  History on Initial Assessment 2/72/2017: This is a 78 yo RH woman with a history of hypertension, hyperlipidemia, with worsening memory. She reports that her memory is "not good," she forgets names, sometimes of her own family members, "sometimes my mind just shuts down." She feels memory started changing in the past year, her husband started noticing some differences around 4 years ago, but her son noticed changes 6-8 years ago. 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 She was in charge of bill payments until last year when she asked her husband to do them because she could not do it anymore. He has been making sure she takes her medications over the past year. Family has convinced her not to drive, because as a passenger she would think they need to go the other way. She frequently misplaces things and is always looking for her pocketbook. She has left the stove on. Her family is also concerned about personality changes, one son thinks she is paranoid that people are whispering about her and conspiring against her. Family reports that in her mind there is "another Edd Arbour" (her husband), they celebrated their 56th  anniversary recently and she did not recognize him, or would say someone is driving his truck or says somebody is writing her checks (her granddaughter has been helping with this). Her mother had Alzheimer's disease. She denies any history of head injuries or alcohol intake.   She was having bad dreams on the Aricept and wanted to switch. She started Exelon but her husband called to report she was declining,  asking where the bathroom was and how long she had been cooking in the kitchen. This caused her to become nauseated and vomit every night, with her BP going up. They requested to resume Aricept, then called back when she was off medication to report that she was up all night pacing and doing crazy things. Aricept was restarted and she again started having bad dreams, "getting into things," and husband felt it was "wearing off during the day. He called a week later to report it was not working because she would not recall who he was and she was not resting good. It was explained that this was likely due to underlying dementia rather than the medication, and she was switched to the Exelon patch in October 2017. She refused to try it and went back to taking Aricept.   Diagnostic Data: MRI brain without contrast did not show any acute changes, there was mild diffuse atrophy and mild chronic microvascular disease. TSH and B12 normal.   MEDICATIONS:  Outpatient Encounter Medications as of 07/18/2019  Medication Sig Note   ALPRAZolam (XANAX) 0.25 MG tablet Take 0.25 mg by mouth 2 (two) times daily as needed for anxiety.    donepezil (ARICEPT) 10 MG tablet TAKE 1 TABLET BY MOUTH ONCE DAILY    memantine (NAMENDA) 10 MG tablet TAKE 1 TABLET BY MOUTH TWICE DAILY    NON FORMULARY EHT age defying supplement; Take 2 tablets daily    simvastatin (ZOCOR) 20 MG tablet Take 20 mg by mouth daily.    traZODone (DESYREL) 50 MG tablet Take 50 mg by mouth daily.    vitamin B-12 (CYANOCOBALAMIN) 500 MCG tablet Take 500 mcg by mouth daily.                   No facility-administered encounter medications on file as of 07/18/2019.      Observations/Objective:  Limited due to nature of phone visit. Patient is heard in the background but is hard of hearing and cannot answer questions.   Assessment and Plan:   This is a 78 yo RH woman with a history of hypertension, hyperlipidemia, with severe dementia. Unable  to do MMSE. She is on Donepezil 10mg  daily and Memantine 10mg  BID without side effects. She is having more behavioral issues, we discussed starting low dose Depakote 125mg  qhs, side effects discussed. After a week, if no side effects, increase to 125mg  BID. We may further uptitrate as tolerated. Continue 24/7 care. She will follow-up in 6 months, her husband knows to call for any changes  Follow Up Instructions:   -I discussed the assessment and treatment plan with the patient;s husband. The patient;s husband was provided an opportunity to ask questions and all were answered. The patient;s husband agreed with the plan and demonstrated an understanding of the instructions.   The patient;s husband was advised to call back or seek an in-person evaluation if the symptoms worsen or if the condition fails to improve as anticipated.    Total Time spent in visit with the patient was:  11:42, of which 100% of  the time was spent in counseling and/or coordinating care on the above.   Pt's husband understands and agrees with the plan of care outlined.     Cameron Sprang, MD

## 2019-07-19 DIAGNOSIS — N390 Urinary tract infection, site not specified: Secondary | ICD-10-CM | POA: Diagnosis not present

## 2019-09-19 DEATH — deceased

## 2020-02-21 ENCOUNTER — Telehealth: Payer: Self-pay | Admitting: Neurology

## 2020-02-21 NOTE — Telephone Encounter (Signed)
Called patient confirm whether she wanted to keep her follow up appointment with Dr. Delice Lesch on 02/23/20 but patient's spouse took the call and stated the patient passed away on October 09, 2019.   Appointment cancelled. Routing to MD for review.

## 2020-02-23 ENCOUNTER — Ambulatory Visit: Payer: Medicare HMO | Admitting: Neurology
